# Patient Record
Sex: Female | Born: 2000 | Race: Black or African American | Hispanic: No | Marital: Single | Smoking: Never smoker
Health system: Southern US, Community
[De-identification: ages and names within clinical notes are randomized; demographics above are authoritative.]

## PROBLEM LIST (undated history)

## (undated) DIAGNOSIS — F909 Attention-deficit hyperactivity disorder, unspecified type: Secondary | ICD-10-CM

## (undated) DIAGNOSIS — F419 Anxiety disorder, unspecified: Secondary | ICD-10-CM

## (undated) HISTORY — DX: Attention-deficit hyperactivity disorder, unspecified type: F90.9

---
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
## 2019-06-25 DIAGNOSIS — Z8616 Personal history of COVID-19: Secondary | ICD-10-CM | POA: Diagnosis not present

## 2020-03-03 DIAGNOSIS — M25512 Pain in left shoulder: Secondary | ICD-10-CM | POA: Diagnosis not present

## 2020-03-03 DIAGNOSIS — G8911 Acute pain due to trauma: Secondary | ICD-10-CM | POA: Diagnosis not present

## 2021-01-20 DIAGNOSIS — Z23 Encounter for immunization: Secondary | ICD-10-CM | POA: Diagnosis not present

## 2021-03-19 ENCOUNTER — Emergency Department (HOSPITAL_COMMUNITY): Payer: Medicaid Other

## 2021-03-19 ENCOUNTER — Emergency Department (HOSPITAL_COMMUNITY)
Admission: EM | Admit: 2021-03-19 | Discharge: 2021-03-19 | Disposition: A | Discharge status: Home / Self Care | Payer: Medicaid Other | Attending: Emergency Medicine | Admitting: Emergency Medicine

## 2021-03-19 ENCOUNTER — Encounter (HOSPITAL_COMMUNITY): Payer: Self-pay | Admitting: Emergency Medicine

## 2021-03-19 ENCOUNTER — Other Ambulatory Visit: Payer: Self-pay

## 2021-03-19 DIAGNOSIS — R079 Chest pain, unspecified: Secondary | ICD-10-CM | POA: Diagnosis not present

## 2021-03-19 DIAGNOSIS — R0789 Other chest pain: Secondary | ICD-10-CM | POA: Diagnosis not present

## 2021-03-19 DIAGNOSIS — R072 Precordial pain: Secondary | ICD-10-CM | POA: Diagnosis not present

## 2021-03-19 DIAGNOSIS — Z20822 Contact with and (suspected) exposure to covid-19: Secondary | ICD-10-CM | POA: Insufficient documentation

## 2021-03-19 DIAGNOSIS — R0602 Shortness of breath: Secondary | ICD-10-CM | POA: Insufficient documentation

## 2021-03-19 DIAGNOSIS — Z7722 Contact with and (suspected) exposure to environmental tobacco smoke (acute) (chronic): Secondary | ICD-10-CM | POA: Insufficient documentation

## 2021-03-19 DIAGNOSIS — R000 Tachycardia, unspecified: Secondary | ICD-10-CM | POA: Insufficient documentation

## 2021-03-19 LAB — BASIC METABOLIC PANEL
Anion gap: 12 (ref 5–15)
BUN: 13 mg/dL (ref 6–20)
CO2: 20 mmol/L — ABNORMAL LOW (ref 22–32)
Calcium: 9.3 mg/dL (ref 8.9–10.3)
Chloride: 103 mmol/L (ref 98–111)
Creatinine, Ser: 1.04 mg/dL — ABNORMAL HIGH (ref 0.44–1.00)
GFR, Estimated: >60 mL/min (ref >60)
Glucose, Bld: 114 mg/dL — ABNORMAL HIGH (ref 70–99)
Potassium: 3 mmol/L — ABNORMAL LOW (ref 3.5–5.1)
Sodium: 135 mmol/L (ref 135–145)

## 2021-03-19 LAB — CBC
HCT: 38.7 % (ref 36.0–46.0)
Hemoglobin: 12.7 g/dL (ref 12.0–15.0)
MCH: 30.2 pg (ref 26.0–34.0)
MCHC: 32.8 g/dL (ref 30.0–36.0)
MCV: 92.1 fL (ref 80.0–100.0)
Platelets: 232 10*3/uL (ref 150–400)
RBC: 4.2 MIL/uL (ref 3.87–5.11)
RDW: 13.2 % (ref 11.5–15.5)
WBC: 7 10*3/uL (ref 4.0–10.5)
nRBC: 0 % (ref 0.0–0.2)

## 2021-03-19 LAB — TROPONIN I (HIGH SENSITIVITY): Troponin I (High Sensitivity): 13 ng/L (ref <18)

## 2021-03-19 LAB — I-STAT BETA HCG BLOOD, ED (MC, WL, AP ONLY): I-stat hCG, quantitative: <5 m[IU]/mL (ref <5)

## 2021-03-19 LAB — RESP PANEL BY RT-PCR (FLU A&B, COVID) ARPGX2
Influenza A by PCR: NEGATIVE
Influenza B by PCR: NEGATIVE
SARS Coronavirus 2 by RT PCR: NEGATIVE

## 2021-03-19 NOTE — ED Provider Notes (Signed)
[facility]-EMERGENCY DEPT Provider Note   CSN: [id number] Arrival date & time: 03/19/21  0347     History Chief Complaint  Patient presents with   Chest Pain   Shortness of Breath    Emily Maynard is a 20 y.o. female.  Patient is a 20 year old female with history of ADHD presenting with complaints of chest pain.  This evening she was on the back patio with her sister, then returned inside and laid down on the floor.  While she was laying there, she began to experience sharp pain to the center of her chest.  She had a strange feeling extending into her neck and felt short of breath.  The symptoms lasted approximately 1 hour, then presents for evaluation of them.  She denies any fevers, chills, or cough.  She denies any leg pain or swelling.  Patient has no prior cardiac history and no cardiac risk factors.  She reports being at the gym earlier today performing exercises with no chest pain or shortness of breath.  She does not recall injuring herself.  The history is provided by the patient.  Chest Pain Pain location:  Substernal area Pain quality: sharp   Pain radiates to:  Does not radiate Pain severity:  Moderate Onset quality:  Sudden Duration:  2 hours Timing:  Constant Progression:  Partially resolved Chronicity:  New Relieved by:  Nothing Worsened by:  Nothing Ineffective treatments:  None tried Associated symptoms: shortness of breath   Shortness of Breath Associated symptoms: chest pain       Past Medical History:  Diagnosis Date   ADHD (attention deficit hyperactivity disorder) 12/13/2012   Followed by Dr. Inda Coke: 03/29/2013 Rating scales do not show need for stimulants, no rx given.     Patient Active Problem List   Diagnosis Date Noted   Acne 06/10/2015   ADHD (attention deficit hyperactivity disorder), combined type 05/01/2014   Learning problem 12/13/2012   Adjustment reaction 08/14/2011    History reviewed. No pertinent surgical  history.   OB History   No obstetric history on file.     History reviewed. No pertinent family history.  Social History   Tobacco Use   Smoking status: Passive Smoke Exposure - Never Smoker   Smokeless tobacco: Never   Tobacco comments:    mom smokes   Substance Use Topics   Alcohol use: No    Alcohol/week: 0.0 standard drinks   Drug use: No    Home Medications Prior to Admission medications   Medication Sig Start Date End Date Taking? Authorizing Provider  clindamycin-benzoyl peroxide (BENZACLIN) gel Apply topically once in the morning Patient not taking: Reported on 11/28/2017 11/24/15   Verneda Skill, FNP  DIFFERIN 0.1 % cream Apply topically at bedtime. Patient not taking: Reported on 11/28/2017 09/05/15   Marijo File, MD  methylphenidate (CONCERTA) 18 MG PO CR tablet Take 1 tablet (18 mg total) by mouth daily. 08/04/16   Verneda Skill, FNP  methylphenidate (CONCERTA) 18 MG PO CR tablet Take 1 tablet (18 mg total) by mouth daily. 08/04/16   Verneda Skill, FNP    Allergies    Shellfish allergy  Review of Systems   Review of Systems  Respiratory:  Positive for shortness of breath.   Cardiovascular:  Positive for chest pain.  All other systems reviewed and are negative.  Physical Exam Updated Vital Signs There were no vitals taken for this visit.  Physical Exam Vitals and nursing note reviewed.  Constitutional:      General: She is not in acute distress.    Appearance: She is well-developed. She is not diaphoretic.  HENT:     Head: Normocephalic and atraumatic.  Cardiovascular:     Rate and Rhythm: Normal rate and regular rhythm.     Heart sounds: No murmur heard.   No friction rub. No gallop.  Pulmonary:     Effort: Pulmonary effort is normal. No respiratory distress.     Breath sounds: Normal breath sounds. No wheezing.     Comments: There is tenderness to the anterior chest wall just left of the sternum.  This reproduces her  symptoms. Abdominal:     General: Bowel sounds are normal. There is no distension.     Palpations: Abdomen is soft.     Tenderness: There is no abdominal tenderness.  Musculoskeletal:        General: Normal range of motion.     Cervical back: Normal range of motion and neck supple.     Right lower leg: No tenderness. No edema.     Left lower leg: No tenderness. No edema.  Skin:    General: Skin is warm and dry.  Neurological:     General: No focal deficit present.     Mental Status: She is alert and oriented to person, place, and time.    ED Results / Procedures / Treatments   Labs (all labs ordered are listed, but only abnormal results are displayed) Labs Reviewed  BASIC METABOLIC PANEL - Abnormal; Notable for the following components:      Result Value   Potassium 3.0 (*)    CO2 20 (*)    Glucose, Bld 114 (*)    Creatinine, Ser 1.04 (*)    All other components within normal limits  RESP PANEL BY RT-PCR (FLU A&B, COVID) ARPGX2  CBC  I-STAT BETA HCG BLOOD, ED ([facility], [facility], [facility] ONLY)  TROPONIN I (HIGH SENSITIVITY)    EKG EKG Interpretation  Date/Time:  Friday March 19 2021 03:54:24 EDT Ventricular Rate:  109 PR Interval:  158 QRS Duration: 72 QT Interval:  311 QTC Calculation: 419 R Axis:   68 Text Interpretation: Sinus tachycardia Right atrial enlargement Artifact in lead(s) I II III aVR aVL aVF V1 V3 Confirmed by Geoffery Lyons ([id number]) on 03/19/2021 4:30:50 AM  Radiology No results found.  Procedures Procedures   Medications Ordered in ED Medications - No data to display  ED Course  I have reviewed the triage vital signs and the nursing notes.  Pertinent labs & imaging results that were available during my care of the patient were reviewed by me and considered in my medical decision making (see chart for details).    MDM Rules/Calculators/A&P  Patient presenting here with complaints of chest pain that seems very atypical for cardiac pain.  She describes a  sharp pain that started in the left center of her chest while she was lying down and following a stressful conversation with her sister.  Patient's work-up is unremarkable including troponin, EKG, chest x-ray, and remainder of laboratory studies.  The discomfort she is experiencing is reproducible with palpation of the anterior chest wall and I strongly suspect a musculoskeletal etiology or possibly anxiety related.  I highly doubt pulmonary embolism as there is no tachycardia, no hypoxia, and no tachypnea.  She has no risk factors for this.  Patient to be discharged with NSAIDs and return as needed.  Final Clinical Impression(s) / ED Diagnoses Final diagnoses:  None    Rx / DC Orders ED Discharge Orders     None        Geoffery Lyons, MD 03/19/21 [phone]

## 2021-03-19 NOTE — Discharge Instructions (Addendum)
Begin taking ibuprofen 400 mg every 6 hours as needed for pain.  Follow-up with primary doctor if not improving in the next week, and return to the ER if you develop worsening pain, difficulty breathing, high fevers, or other issues.

## 2021-04-14 DIAGNOSIS — S80912A Unspecified superficial injury of left knee, initial encounter: Secondary | ICD-10-CM | POA: Diagnosis not present

## 2021-04-14 DIAGNOSIS — F411 Generalized anxiety disorder: Secondary | ICD-10-CM | POA: Diagnosis not present

## 2021-04-14 DIAGNOSIS — F321 Major depressive disorder, single episode, moderate: Secondary | ICD-10-CM | POA: Diagnosis not present

## 2021-04-14 DIAGNOSIS — F4011 Social phobia, generalized: Secondary | ICD-10-CM | POA: Diagnosis not present

## 2021-04-14 DIAGNOSIS — R051 Acute cough: Secondary | ICD-10-CM | POA: Diagnosis not present

## 2021-04-14 DIAGNOSIS — S80911A Unspecified superficial injury of right knee, initial encounter: Secondary | ICD-10-CM | POA: Diagnosis not present

## 2021-04-23 DIAGNOSIS — J00 Acute nasopharyngitis [common cold]: Secondary | ICD-10-CM | POA: Diagnosis not present

## 2021-04-23 DIAGNOSIS — Z3042 Encounter for surveillance of injectable contraceptive: Secondary | ICD-10-CM | POA: Diagnosis not present

## 2021-04-23 DIAGNOSIS — Z3202 Encounter for pregnancy test, result negative: Secondary | ICD-10-CM | POA: Diagnosis not present

## 2021-05-21 DIAGNOSIS — F411 Generalized anxiety disorder: Secondary | ICD-10-CM | POA: Diagnosis not present

## 2021-05-21 DIAGNOSIS — F331 Major depressive disorder, recurrent, moderate: Secondary | ICD-10-CM | POA: Diagnosis not present

## 2021-05-21 DIAGNOSIS — F4011 Social phobia, generalized: Secondary | ICD-10-CM | POA: Diagnosis not present

## 2021-07-02 DIAGNOSIS — F411 Generalized anxiety disorder: Secondary | ICD-10-CM | POA: Diagnosis not present

## 2021-07-02 DIAGNOSIS — F331 Major depressive disorder, recurrent, moderate: Secondary | ICD-10-CM | POA: Diagnosis not present

## 2021-08-06 DIAGNOSIS — F411 Generalized anxiety disorder: Secondary | ICD-10-CM | POA: Diagnosis not present

## 2021-08-06 DIAGNOSIS — F3341 Major depressive disorder, recurrent, in partial remission: Secondary | ICD-10-CM | POA: Diagnosis not present

## 2021-09-10 DIAGNOSIS — F3341 Major depressive disorder, recurrent, in partial remission: Secondary | ICD-10-CM | POA: Diagnosis not present

## 2021-09-10 DIAGNOSIS — F902 Attention-deficit hyperactivity disorder, combined type: Secondary | ICD-10-CM | POA: Diagnosis not present

## 2021-09-10 DIAGNOSIS — F411 Generalized anxiety disorder: Secondary | ICD-10-CM | POA: Diagnosis not present

## 2021-09-27 ENCOUNTER — Other Ambulatory Visit (HOSPITAL_COMMUNITY)
Admission: RE | Admit: 2021-09-27 | Discharge: 2021-09-27 | Disposition: A | Discharge status: Home / Self Care | Payer: Medicaid Other | Source: Ambulatory Visit | Attending: Advanced Practice Midwife | Admitting: Advanced Practice Midwife

## 2021-09-27 ENCOUNTER — Ambulatory Visit (INDEPENDENT_AMBULATORY_CARE_PROVIDER_SITE_OTHER): Payer: Medicaid Other | Admitting: Advanced Practice Midwife

## 2021-09-27 ENCOUNTER — Encounter: Payer: Self-pay | Admitting: Advanced Practice Midwife

## 2021-09-27 VITALS — BP 113/73 | HR 62 | Ht 65.0 in | Wt 127.9 lb

## 2021-09-27 DIAGNOSIS — Z3009 Encounter for other general counseling and advice on contraception: Secondary | ICD-10-CM | POA: Diagnosis not present

## 2021-09-27 DIAGNOSIS — Z113 Encounter for screening for infections with a predominantly sexual mode of transmission: Secondary | ICD-10-CM | POA: Insufficient documentation

## 2021-09-27 DIAGNOSIS — Z01419 Encounter for gynecological examination (general) (routine) without abnormal findings: Secondary | ICD-10-CM | POA: Diagnosis not present

## 2021-09-27 DIAGNOSIS — Z124 Encounter for screening for malignant neoplasm of cervix: Secondary | ICD-10-CM | POA: Insufficient documentation

## 2021-09-27 DIAGNOSIS — R109 Unspecified abdominal pain: Secondary | ICD-10-CM | POA: Diagnosis not present

## 2021-09-27 DIAGNOSIS — A749 Chlamydial infection, unspecified: Secondary | ICD-10-CM

## 2021-09-27 DIAGNOSIS — Z3042 Encounter for surveillance of injectable contraceptive: Secondary | ICD-10-CM | POA: Diagnosis not present

## 2021-09-27 LAB — POCT URINE PREGNANCY: Preg Test, Ur: NEGATIVE

## 2021-09-27 MED ORDER — MEDROXYPROGESTERONE ACETATE 150 MG/ML IM SUSP: 150.0000 mg | INTRAMUSCULAR | 4 refills | Status: AC

## 2021-09-27 MED ORDER — MEDROXYPROGESTERONE ACETATE 150 MG/ML IM SUSP
150.0000 mg | Freq: Once | INTRAMUSCULAR | Status: AC
  Administered 2021-09-27: 150 mg via INTRAMUSCULAR

## 2021-09-27 NOTE — Progress Notes (Signed)
? ?  Subjective:  ?  ? Emily Maynard is a 21 y.o. female here at [facility] for a routine exam.  Current complaints: abdominal pain, upper/epigastric and lower cramping pain that is intermittent, mostly occurs at night when lying down.  Hx anxiety, has psychiatry and is taking medications. She reports her anxiety is improved and she feels like she is doing well.  Personal health questionnaire reviewed: yes. ? ?Do you have a primary care provider? yes ?Do you feel safe at home? yes ? ?[place] Office Visit from 09/27/2021 in [place]  ?PHQ-2 Total Score 2  ? ?  ? ? ?Health Maintenance Due  ?Topic Date Due  ? Hepatitis C Screening  Never done  ? PAP-Cervical Cytology Screening  Never done  ? PAP SMEAR-Modifier  Never done  ? TETANUS/TDAP  09/08/2020  ?  ? ?Risk factors for chronic health problems: ?Smoking: ?Alchohol/how much: ?Pt BMI: Body mass index is 21.28 kg/m?. ?  ?Gynecologic History ?Patient's last menstrual period was 09/17/2021. ?Contraception: condoms ?Last Pap: n/a.  ?Last mammogram: n/a.  ? ?Obstetric History ?OB History  ?No obstetric history on file.  ? ? ? ?The following portions of the patient's history were reviewed and updated as appropriate: allergies, current medications, past family history, past medical history, past social history, past surgical history, and problem list. ? ?Review of Systems ?Pertinent items noted in HPI and remainder of comprehensive ROS otherwise negative.  ?  ?Objective:  ? ?BP 113/73   Pulse 62   Ht 5\' 5"  (1.651 m)   Wt 127 lb 14.4 oz (58 kg)   LMP 09/17/2021   BMI 21.28 kg/m?  ?VS reviewed, nursing note reviewed,  ?Constitutional: well developed, well nourished, no distress ?HEENT: normocephalic ?CV: normal rate ?Pulm/chest wall: normal effort ?Breast Exam:  exam performed: right breast normal without mass, skin or nipple changes or axillary nodes, left breast normal without mass, skin or nipple changes or axillary nodes, bilateral  nipple piercing without complication ?Abdomen: soft ?Neuro: alert and oriented x 3 ?Skin: warm, dry ?Psych: affect normal ?Pelvic exam: Performed: Cervix pink, visually closed, without lesion, scant white creamy discharge, vaginal walls and external genitalia normal ?Bimanual exam: Cervix 0/long/high, firm, anterior, neg CMT, uterus nontender, nonenlarged, adnexa without tenderness, enlargement, or mass  ? ? ?   ?Assessment/Plan:  ? ?1. Routine screening for STI (sexually transmitted infection) ? ?- Cervicovaginal ancillary only( [place]) ?- HepB+HepC+HIV Panel ?- RPR ? ?2. Screening for cervical cancer ? ?- Cytology - PAP( [place]) ? ?3. Abdominal pain in female ?--Pain in upper abdomen c/w epigastric/digestive pain.   ? ?4. Well woman exam with routine gynecological exam ? ? ?5. General counseling and advice for contraceptive management ?--Discussed pt contraceptive plans and reviewed contraceptive methods based on pt preferences and effectiveness.  Pt on the phone with her mother, who also asked questions and was part of decision making at pt request.  Pt prefers Depo at this time, will reevaluate at a later time.  She had weight gain with Depo previously but is willing to try it again. ? ?- medroxyPROGESTERone (DEPO-PROVERA) injection 150 mg ?- medroxyPROGESTERone (DEPO-PROVERA) 150 MG/ML injection; Inject 1 mL (150 mg total) into the muscle every 3 (three) months.  Dispense: 1 mL; Refill: 4  ? ? ? ?Return in about 1 year (around 09/28/2022) for annual exam.  ? ?Fatima Blank, CNM ?4:13 PM   ?

## 2021-09-27 NOTE — Patient Instructions (Signed)
For upper abdominal pain, try Pepcid (famotidine) 20 mg twice per day as needed.  If symptoms do not improve, follow up with primary care.   ?

## 2021-09-27 NOTE — Addendum Note (Signed)
Addended by: Jearld Adjutant on: 09/27/2021 04:36 PM ? ? Modules accepted: Orders ? ?

## 2021-09-27 NOTE — Progress Notes (Signed)
New GYN pt in office to establish care. Pt c/o abdominal pain in the upper regions. Pt requesting STD testing.  ? ?Pt scored high on PHQ9 and GAD7 but is currently on medications and seeing a counselor.  ?

## 2021-09-27 NOTE — Addendum Note (Signed)
Addended by: Jearld Adjutant on: 09/27/2021 04:31 PM ? ? Modules accepted: Orders ? ?

## 2021-09-28 ENCOUNTER — Other Ambulatory Visit: Payer: Medicaid Other

## 2021-09-28 DIAGNOSIS — Z01419 Encounter for gynecological examination (general) (routine) without abnormal findings: Secondary | ICD-10-CM | POA: Diagnosis not present

## 2021-09-28 DIAGNOSIS — Z113 Encounter for screening for infections with a predominantly sexual mode of transmission: Secondary | ICD-10-CM | POA: Diagnosis not present

## 2021-09-28 LAB — RPR: RPR Ser Ql: NONREACTIVE

## 2021-09-29 LAB — CERVICOVAGINAL ANCILLARY ONLY
Chlamydia: POSITIVE — AB
Comment: NEGATIVE
Comment: NEGATIVE
Comment: NORMAL
Neisseria Gonorrhea: NEGATIVE
Trichomonas: NEGATIVE

## 2021-09-29 LAB — HEPATITIS C ANTIBODY: Hep C Virus Ab: NONREACTIVE

## 2021-09-29 LAB — HEPATITIS B SURFACE ANTIGEN: Hepatitis B Surface Ag: NEGATIVE

## 2021-09-29 LAB — CYTOLOGY - PAP: Diagnosis: NEGATIVE

## 2021-09-29 LAB — HIV ANTIBODY (ROUTINE TESTING W REFLEX): HIV Screen 4th Generation wRfx: NONREACTIVE

## 2021-09-30 ENCOUNTER — Other Ambulatory Visit: Payer: Self-pay | Admitting: Emergency Medicine

## 2021-09-30 MED ORDER — AZITHROMYCIN 500 MG PO TABS: 500.0000 mg | ORAL_TABLET | Freq: Once | ORAL | 0 refills | Status: AC

## 2021-09-30 NOTE — Progress Notes (Signed)
TC to patient to discuss results and Rx. Rx sent to pharmacy.  ?

## 2021-10-02 ENCOUNTER — Telehealth: Payer: Self-pay | Admitting: Advanced Practice Midwife

## 2021-10-02 MED ORDER — DOXYCYCLINE HYCLATE 100 MG PO CAPS: 100.0000 mg | ORAL_CAPSULE | Freq: Two times a day (BID) | ORAL | 1 refills | Status: DC

## 2021-10-02 NOTE — Addendum Note (Signed)
Addended by: Sharen Counter A on: 10/02/2021 02:30 PM ? ? Modules accepted: Orders ? ?

## 2021-10-02 NOTE — Telephone Encounter (Signed)
Called pt to discuss lab results with positive chlamydia test on 09/27/21.  Message left for pt to return call.  I see that RN spoke with pt on 4/20 about results and Rx was sent by Dr Alysia Penna. Pt Pap, and other STI labs were all normal.  No other abnormal labs to review with pt if she returns call. Her partner needs to be treated as well.   ?

## 2021-10-07 DIAGNOSIS — F411 Generalized anxiety disorder: Secondary | ICD-10-CM | POA: Diagnosis not present

## 2021-10-07 DIAGNOSIS — R519 Headache, unspecified: Secondary | ICD-10-CM | POA: Diagnosis not present

## 2021-10-07 DIAGNOSIS — R0782 Intercostal pain: Secondary | ICD-10-CM | POA: Diagnosis not present

## 2021-10-15 ENCOUNTER — Encounter (HOSPITAL_COMMUNITY): Payer: Self-pay

## 2021-10-15 ENCOUNTER — Other Ambulatory Visit: Payer: Self-pay

## 2021-10-15 ENCOUNTER — Emergency Department (HOSPITAL_COMMUNITY): Payer: Medicaid Other

## 2021-10-15 ENCOUNTER — Emergency Department (HOSPITAL_COMMUNITY)
Admission: EM | Admit: 2021-10-15 | Discharge: 2021-10-15 | Disposition: A | Discharge status: Home / Self Care | Payer: Medicaid Other | Attending: Emergency Medicine | Admitting: Emergency Medicine

## 2021-10-15 DIAGNOSIS — F410 Panic disorder [episodic paroxysmal anxiety] without agoraphobia: Secondary | ICD-10-CM | POA: Diagnosis not present

## 2021-10-15 DIAGNOSIS — F3341 Major depressive disorder, recurrent, in partial remission: Secondary | ICD-10-CM | POA: Diagnosis not present

## 2021-10-15 DIAGNOSIS — R7989 Other specified abnormal findings of blood chemistry: Secondary | ICD-10-CM

## 2021-10-15 DIAGNOSIS — R200 Anesthesia of skin: Secondary | ICD-10-CM | POA: Diagnosis not present

## 2021-10-15 DIAGNOSIS — E039 Hypothyroidism, unspecified: Secondary | ICD-10-CM | POA: Diagnosis not present

## 2021-10-15 DIAGNOSIS — R946 Abnormal results of thyroid function studies: Secondary | ICD-10-CM | POA: Diagnosis not present

## 2021-10-15 DIAGNOSIS — R202 Paresthesia of skin: Secondary | ICD-10-CM | POA: Diagnosis not present

## 2021-10-15 DIAGNOSIS — R9431 Abnormal electrocardiogram [ECG] [EKG]: Secondary | ICD-10-CM | POA: Diagnosis not present

## 2021-10-15 DIAGNOSIS — F411 Generalized anxiety disorder: Secondary | ICD-10-CM | POA: Diagnosis not present

## 2021-10-15 DIAGNOSIS — R479 Unspecified speech disturbances: Secondary | ICD-10-CM | POA: Diagnosis not present

## 2021-10-15 LAB — CBC WITH DIFFERENTIAL/PLATELET
Abs Immature Granulocytes: 0.01 10*3/uL (ref 0.00–0.07)
Basophils Absolute: 0 10*3/uL (ref 0.0–0.1)
Basophils Relative: 1 %
Eosinophils Absolute: 0.1 10*3/uL (ref 0.0–0.5)
Eosinophils Relative: 1 %
HCT: 40.8 % (ref 36.0–46.0)
Hemoglobin: 13.5 g/dL (ref 12.0–15.0)
Immature Granulocytes: 0 %
Lymphocytes Relative: 40 %
Lymphs Abs: 1.9 10*3/uL (ref 0.7–4.0)
MCH: 30.1 pg (ref 26.0–34.0)
MCHC: 33.1 g/dL (ref 30.0–36.0)
MCV: 91.1 fL (ref 80.0–100.0)
Monocytes Absolute: 0.3 10*3/uL (ref 0.1–1.0)
Monocytes Relative: 6 %
Neutro Abs: 2.4 10*3/uL (ref 1.7–7.7)
Neutrophils Relative %: 52 %
Platelets: 238 10*3/uL (ref 150–400)
RBC: 4.48 MIL/uL (ref 3.87–5.11)
RDW: 12.6 % (ref 11.5–15.5)
WBC: 4.6 10*3/uL (ref 4.0–10.5)
nRBC: 0 % (ref 0.0–0.2)

## 2021-10-15 LAB — URINALYSIS, ROUTINE W REFLEX MICROSCOPIC
Bilirubin Urine: NEGATIVE
Glucose, UA: NEGATIVE mg/dL
Ketones, ur: NEGATIVE mg/dL
Nitrite: NEGATIVE
Protein, ur: NEGATIVE mg/dL
Specific Gravity, Urine: 1.014 (ref 1.005–1.030)
pH: 6 (ref 5.0–8.0)

## 2021-10-15 LAB — I-STAT BETA HCG BLOOD, ED (MC, WL, AP ONLY): I-stat hCG, quantitative: <5 m[IU]/mL (ref <5)

## 2021-10-15 LAB — COMPREHENSIVE METABOLIC PANEL
ALT: 11 U/L (ref 0–44)
AST: 17 U/L (ref 15–41)
Albumin: 4.2 g/dL (ref 3.5–5.0)
Alkaline Phosphatase: 42 U/L (ref 38–126)
Anion gap: 6 (ref 5–15)
BUN: 16 mg/dL (ref 6–20)
CO2: 23 mmol/L (ref 22–32)
Calcium: 9.1 mg/dL (ref 8.9–10.3)
Chloride: 107 mmol/L (ref 98–111)
Creatinine, Ser: 0.99 mg/dL (ref 0.44–1.00)
GFR, Estimated: >60 mL/min (ref >60)
Glucose, Bld: 112 mg/dL — ABNORMAL HIGH (ref 70–99)
Potassium: 3.8 mmol/L (ref 3.5–5.1)
Sodium: 136 mmol/L (ref 135–145)
Total Bilirubin: 0.8 mg/dL (ref 0.3–1.2)
Total Protein: 7.7 g/dL (ref 6.5–8.1)

## 2021-10-15 LAB — TSH: TSH: 0.347 u[IU]/mL — ABNORMAL LOW (ref 0.350–4.500)

## 2021-10-15 MED ORDER — LORAZEPAM 1 MG PO TABS: ORAL_TABLET | ORAL | 0 refills | Status: DC

## 2021-10-15 NOTE — Discharge Instructions (Signed)
Your workup today was overall very reassuring, however, one of your thyroid function tests indicates that you may have too much thyroid hormone circulating in your body which can cause symptoms of anxiety including palpitations, shortness of breath and tremors.  I have sent you in a referral for the [facility] to establish care to have this better evaluated.  You can also see if the student health center is equipped to handle this as well.  Until then continue managing your anxiety as described.  I have sent you in a few tablets of Ativan that you will take as needed-meaning when you are actually experiencing a panic attack-which can help improve your symptoms at that time.  If you need more of this medication, you can follow-up with campus health. ?

## 2021-10-15 NOTE — ED Triage Notes (Signed)
Patient's stepmother reports that the patient has been having more frequent panic attacks and the last one at 1200 today. Patient states when these panic attacks occur she gets numbness of the left arm and has difficulty with speech. ?Patient states she is not currently having a panic attack and does not have numbness of the left arm. No slurred speech noted. ?Patient saw the campus NP today and referred the patient to the ED. ?

## 2021-10-15 NOTE — ED Provider Triage Note (Signed)
Emergency Medicine Provider Triage Evaluation Note ? ?Emily Maynard , a 21 y.o. female  was evaluated in triage.  Pt complains of panic attack. SHe has a hx of panic attacks. She gets L arm paresthisa, hand cramping. Facial numbness and inablity to speak. Sent in by [facility] for further eval. Will see neuro this coming Tuesday. Sxs have resolved. Hx of abnormal thyroid studies. ? ?Review of Systems  ?Positive: panic ?Negative: sob ? ?Physical Exam  ?BP 116/74 (BP Location: Left Arm)   Pulse 69   Temp 98.2 ?F (36.8 ?C) (Oral)   Resp 16   Ht 5\' 5"  (1.651 m)   Wt 59 kg   LMP 09/17/2021   SpO2 97%   BMI 21.63 kg/m?  ?Gen:   Awake, no distress   ?Resp:  Normal effort  ?MSK:   Moves extremities without difficulty  ?Other:  No focal deficits ? ?Medical Decision Making  ?Medically screening exam initiated at 3:55 PM.  Appropriate orders placed.  Cyril Railey was informed that the remainder of the evaluation will be completed by another provider, this initial triage assessment does not replace that evaluation, and the importance of remaining in the ED until their evaluation is complete. ? ?Work up inititated ?  ?Emily Ports, PA-C ?10/15/21 1559 ? ?

## 2021-10-17 NOTE — ED Provider Notes (Signed)
?[facility]-EMERGENCY DEPT ?Provider Note ? ? ?CSN: [id number] ?Arrival date & time: 10/15/21  1544 ? ?  ? ?History ? ?Chief Complaint  ?Patient presents with  ? Panic Attack  ? ? ?Emily Maynard is a 21 y.o. female with history of ADHD who comes in with complaints of more frequent panic attacks with most recent one earlier today.  Patient states that when her panic acts occur, she experiences numbness of the left arm and feels that she has difficulty with speech.  She also feels chest pain described as pressure and feelings of doom.  Patient saw NP at student health today who referred her to the emergency department for additional evaluation.  Currently, patient denies all symptoms and feels fine.  She takes hydroxyzine and methylphenidate for her medications. ? ?HPI ? ?  ? ?Home Medications ?Prior to Admission medications   ?Medication Sig Start Date End Date Taking? Authorizing Provider  ?LORazepam (ATIVAN) 1 MG tablet Take 1 tablet when experiencing panic attack symptoms. Do not drive afterwards. 10/15/21  Yes Raynald Blend R, PA-C  ?clindamycin-benzoyl peroxide (BENZACLIN) gel Apply topically once in the morning ?Patient not taking: Reported on 11/28/2017 11/24/15   Verneda Skill, FNP  ?DIFFERIN 0.1 % cream Apply topically at bedtime. ?Patient not taking: Reported on 11/28/2017 09/05/15   Marijo File, MD  ?medroxyPROGESTERone (DEPO-PROVERA) 150 MG/ML injection Inject 1 mL (150 mg total) into the muscle every 3 (three) months. 09/27/21   Leftwich-Kirby, Wilmer Floor, CNM  ?methylphenidate (CONCERTA) 18 MG PO CR tablet Take 1 tablet (18 mg total) by mouth daily. ?Patient not taking: Reported on 09/27/2021 08/04/16   Verneda Skill, FNP  ?methylphenidate (CONCERTA) 18 MG PO CR tablet Take 1 tablet (18 mg total) by mouth daily. ?Patient not taking: Reported on 09/27/2021 08/04/16   Verneda Skill, FNP  ?   ? ?Allergies    ?Shellfish allergy   ? ?Review of Systems   ?Review of Systems ? ?Physical  Exam ?Updated Vital Signs ?BP (!) 108/51 (BP Location: Left Arm)   Pulse 65   Temp 98 ?F (36.7 ?C) (Oral)   Resp 20   Ht 5\' 5"  (1.651 m)   Wt 59 kg   LMP 09/17/2021   SpO2 99%   BMI 21.63 kg/m?  ?Physical Exam ?Vitals and nursing note reviewed.  ?Constitutional:   ?   General: She is not in acute distress. ?   Appearance: She is not ill-appearing.  ?HENT:  ?   Head: Atraumatic.  ?Eyes:  ?   Extraocular Movements: Extraocular movements intact.  ?   Conjunctiva/sclera: Conjunctivae normal.  ?   Pupils: Pupils are equal, round, and reactive to light.  ?   Comments: No obvious proptosis of the eyes  ?Cardiovascular:  ?   Rate and Rhythm: Normal rate and regular rhythm.  ?   Pulses: Normal pulses.  ?   Heart sounds: No murmur heard. ?Pulmonary:  ?   Effort: Pulmonary effort is normal. No respiratory distress.  ?   Breath sounds: Normal breath sounds.  ?Abdominal:  ?   General: Abdomen is flat. There is no distension.  ?   Palpations: Abdomen is soft.  ?   Tenderness: There is no abdominal tenderness.  ?Musculoskeletal:     ?   General: Normal range of motion.  ?   Cervical back: Normal range of motion.  ?Skin: ?   General: Skin is warm and dry.  ?   Capillary Refill: Capillary  refill takes less than 2 seconds.  ?Neurological:  ?   General: No focal deficit present.  ?   Mental Status: She is alert.  ?   Comments: Speech is clear, able to follow commands ?CN III-XII intact ?Normal strength in upper and lower extremities bilaterally including dorsiflexion and plantar flexion, strong and equal grip strength ?Sensation normal to light and sharp touch ?Moves extremities without ataxia, coordination intact ?Normal finger to nose and rapid alternating movements ?No pronator drift ? ?  ?Psychiatric:     ?   Mood and Affect: Mood normal.  ? ? ?ED Results / Procedures / Treatments   ?Labs ?(all labs ordered are listed, but only abnormal results are displayed) ?Labs Reviewed  ?URINALYSIS, ROUTINE W REFLEX MICROSCOPIC -  Abnormal; Notable for the following components:  ?    Result Value  ? APPearance HAZY (*)   ? Hgb urine dipstick MODERATE (*)   ? Leukocytes,Ua SMALL (*)   ? Bacteria, UA MANY (*)   ? All other components within normal limits  ?COMPREHENSIVE METABOLIC PANEL - Abnormal; Notable for the following components:  ? Glucose, Bld 112 (*)   ? All other components within normal limits  ?TSH - Abnormal; Notable for the following components:  ? TSH 0.347 (*)   ? All other components within normal limits  ?CBC WITH DIFFERENTIAL/PLATELET  ?I-STAT BETA HCG BLOOD, ED ([facility], [facility], [facility] ONLY)  ? ? ?EKG ?EKG Interpretation ? ?Date/Time:  Friday Oct 15 2021 16:22:15 EDT ?Ventricular Rate:  65 ?PR Interval:  161 ?QRS Duration: 73 ?QT Interval:  367 ?QTC Calculation: 382 ?R Axis:   75 ?Text Interpretation: Sinus rhythm No significant change since last tracing Confirmed by Gwyneth SproutPlunkett, Whitney ([id number]) on 10/16/2021 3:16:56 PM ? ?Radiology ?No results found. ? ?Procedures ?Procedures  ? ? ?Medications Ordered in ED ?Medications - No data to display ? ?ED Course/ Medical Decision Making/ A&P ?  ?                        ?Medical Decision Making ?Risk ?Prescription drug management. ? ? ?Social determinants of health:  ?Social History  ? ?Socioeconomic History  ? Marital status: Single  ?  Spouse name: Not on file  ? Number of children: Not on file  ? Years of education: Not on file  ? Highest education level: Not on file  ?Occupational History  ? Not on file  ?Tobacco Use  ? Smoking status: Never  ?  Passive exposure: Yes  ? Smokeless tobacco: Never  ? Tobacco comments:  ?  mom smokes   ?Vaping Use  ? Vaping Use: Never used  ?Substance and Sexual Activity  ? Alcohol use: Yes  ?  Comment: socially  ? Drug use: Yes  ?  Types: Marijuana  ? Sexual activity: Yes  ?  Partners: Male  ?Other Topics Concern  ? Not on file  ?Social History Narrative  ? Lives with:  patient, mother and sister  ? Family relations:  good  ? Friends/Peers:  has few friends  ? School:   is in 10th grade and is doing fairly well  ? Future Plans:  college  ? Nutrition/Eating Behaviors: Eats well   ? Exercise:  none  ? Sports:  basketball and track  ? Sleep:  no sleep issues  ?   ? Confidentiality was discussed with the patient and if applicable, with caregiver as well.  ?   ? Patient's personal or confidential  phone number:   ? Tobacco?  no  ? Drugs/ETOH?  no  ? Partner preference?  female Sexually Active?  no    ? Pregnancy Prevention:  condoms, reviewed condoms & plan B  ? Safe at home, in school & in relationships?  Yes  ? Safe to self?  Yes   ?   ? ?Social Determinants of Health  ? ?Financial Resource Strain: Not on file  ?Food Insecurity: Not on file  ?Transportation Needs: Not on file  ?Physical Activity: Not on file  ?Stress: Not on file  ?Social Connections: Not on file  ?Intimate Partner Violence: Not on file  ? ? ? ?Initial impression: ? ?This patient presents to the ED for concern of panic attacks, this involves an extensive number of treatment options, and is a complaint that carries with it a high risk of complications and morbidity.   Differentials include anxiety, medication side effect, thyroid disorder, strokes.  ? ?Comorbidities affecting care:  ?Anxiety ? ?Additional history obtained: ?Stepmother ? ?Lab Tests ? ?I Ordered, reviewed, and interpreted labs. The pertinent results include:  ?CMP normal, negative pregnancy test, CBC normal ?UA with possible UTI, patient denies UTI symptoms ?TSH low ? ?Imaging Studies ordered: ? ?I ordered imaging studies including  ?CT head without acute findings ?I independently visualized and interpreted imaging and I agree with the radiologist interpretation.  ? ?EKG: ?Normal sinus rhythm ? ? ?ED Course/Re-evaluation: ?21 year old female in no acute distress, nontoxic-appearing presents to the ED for evaluation of the frequent panic attacks with associated numbness and difficulty speaking during these episodes.  Patient's vitals are without significant  abnormalities.  Physical exam shows thin 21 year old female without significant findings.  CT head was normal.  Labs were overall normal, her though her TSH was slightly low.  It is possible that patient has un

## 2021-10-27 ENCOUNTER — Encounter: Payer: Self-pay | Admitting: Nurse Practitioner

## 2021-10-27 ENCOUNTER — Ambulatory Visit: Payer: Medicaid Other | Attending: Nurse Practitioner | Admitting: Nurse Practitioner

## 2021-10-27 ENCOUNTER — Other Ambulatory Visit (HOSPITAL_COMMUNITY)
Admission: RE | Admit: 2021-10-27 | Discharge: 2021-10-27 | Disposition: A | Discharge status: Home / Self Care | Payer: Medicaid Other | Source: Ambulatory Visit | Attending: Nurse Practitioner | Admitting: Nurse Practitioner

## 2021-10-27 VITALS — BP 105/72 | HR 65 | Temp 98.7°F | Resp 16 | Ht 65.0 in | Wt 132.0 lb

## 2021-10-27 DIAGNOSIS — F902 Attention-deficit hyperactivity disorder, combined type: Secondary | ICD-10-CM | POA: Diagnosis not present

## 2021-10-27 DIAGNOSIS — Z7251 High risk heterosexual behavior: Secondary | ICD-10-CM | POA: Diagnosis not present

## 2021-10-27 DIAGNOSIS — F32A Depression, unspecified: Secondary | ICD-10-CM | POA: Diagnosis not present

## 2021-10-27 DIAGNOSIS — Z7689 Persons encountering health services in other specified circumstances: Secondary | ICD-10-CM

## 2021-10-27 DIAGNOSIS — Z114 Encounter for screening for human immunodeficiency virus [HIV]: Secondary | ICD-10-CM

## 2021-10-27 DIAGNOSIS — Z23 Encounter for immunization: Secondary | ICD-10-CM

## 2021-10-27 DIAGNOSIS — F419 Anxiety disorder, unspecified: Secondary | ICD-10-CM | POA: Diagnosis not present

## 2021-10-27 MED ORDER — HYDROXYZINE HCL 25 MG PO TABS: 25.0000 mg | ORAL_TABLET | Freq: Three times a day (TID) | ORAL | 3 refills | Status: DC | PRN

## 2021-10-27 MED ORDER — METHYLPHENIDATE HCL ER (OSM) 18 MG PO TBCR: 18.0000 mg | EXTENDED_RELEASE_TABLET | Freq: Every day | ORAL | 0 refills | Status: DC

## 2021-10-27 MED ORDER — ESCITALOPRAM OXALATE 20 MG PO TABS: 20.0000 mg | ORAL_TABLET | Freq: Every day | ORAL | 1 refills | Status: DC

## 2021-10-27 NOTE — Progress Notes (Signed)
? ?Assessment & Plan:  ?Emily Maynard was seen today for establish care and hospitalization follow-up. ? ?Diagnoses and all orders for this visit: ? ?Encounter to establish care ? ?ADHD (attention deficit hyperactivity disorder), combined type ?-     methylphenidate (CONCERTA) 18 MG PO CR tablet; Take 1 tablet (18 mg total) by mouth daily. ? ?Anxiety and depression ?-     Ambulatory referral to Integrated Behavioral Health ?-     escitalopram (LEXAPRO) 20 MG tablet; Take 1 tablet (20 mg total) by mouth daily. ?-     hydrOXYzine (ATARAX) 25 MG tablet; Take 1 tablet (25 mg total) by mouth 3 (three) times daily as needed. ? ?High risk heterosexual behavior ?-     Cervicovaginal ancillary only ? ?Encounter for screening for HIV ?-     HIV antibody (with reflex) ? ?Need for Tdap vaccination ?-     Tdap vaccine greater than or equal to 7yo IM ? ? ? ?Patient has been counseled on age-appropriate routine health concerns for screening and prevention. These are reviewed and up-to-date. Referrals have been placed accordingly. Immunizations are up-to-date or declined.    ?Subjective:  ? ?Chief Complaint  ?Patient presents with  ? Establish Care  ? Hospitalization Follow-up  ? ?HPI ?Emily Maynard 21 y.o. female presents to office today for hospital follow up to anxiety and to establish care. ? ?She has a past medical history of ADHD(12/13/2012) and adjustment disorder ? ?ADHD ?She was diagnosed with ADHD in grade school. Most recently treated with concerta 18mg  daily.   ? ?She transferred to [employer] from [employer] last year. States one day at [facility] she just woke up and did not leave her room for about a week. She also started missing classes and stopped running track. Her Grandmother passed about 1 year ago and she recalls having a panic attack shortly thereafter. Over the past year her panic attacks have seemed to increase in frequency and intensity. Associated symptoms include: diaphoresis and muscle  rigidity in her left arm.  ?She did previously receive psychotherapy from the [facility] health [facility] at the university.  ?Currently taking lexapro 20 mg daily and hydroxyzine 10mg  2 tablets TID prn for anxiety and depression. .  ? ? ?GU ?She was diagnosed with chlamydia in the past and reports completing treatment as prescribed.  ? ?HFU ?Evaluated in the ED on 10-15-2021 for worsening panic attacks,chest pain and left arm numbness. Head CT negative. EKG NSR. VSS. TSH was borderline low. She was discharged home with a temp supply of ativan to use as needed.  ?Lab Results  ?Component Value Date  ? TSH 0.347 (L) 10/15/2021  ?  ? ?Review of Systems  ?Constitutional:  Negative for fever, malaise/fatigue and weight loss.  ?HENT: Negative.  Negative for nosebleeds.   ?Eyes: Negative.  Negative for blurred vision, double vision and photophobia.  ?Respiratory: Negative.  Negative for cough and shortness of breath.   ?Cardiovascular: Negative.  Negative for chest pain, palpitations and leg swelling.  ?Gastrointestinal: Negative.  Negative for heartburn, nausea and vomiting.  ?Musculoskeletal: Negative.  Negative for myalgias.  ?Neurological: Negative.  Negative for dizziness, focal weakness, seizures and headaches.  ?Psychiatric/Behavioral:  Positive for depression. Negative for hallucinations, memory loss, substance abuse and suicidal ideas. The patient is nervous/anxious. The patient does not have insomnia.   ? ?Past Medical History:  ?Diagnosis Date  ? ADHD (attention deficit hyperactivity disorder) 12/13/2012  ? Followed by Dr. 12/15/2021: 03/29/2013 Rating scales do not  show need for stimulants, no rx given.   ? ? ?History reviewed. No pertinent surgical history. ? ?History reviewed. No pertinent family history. ? ?Social History Reviewed with no changes to be made today.  ? ?Outpatient Medications Prior to Visit  ?Medication Sig Dispense Refill  ? medroxyPROGESTERone (DEPO-PROVERA) 150 MG/ML injection Inject 1 mL (150 mg total)  into the muscle every 3 (three) months. 1 mL 4  ? escitalopram (LEXAPRO) 20 MG tablet Take 20 mg by mouth daily.    ? hydrOXYzine (ATARAX) 10 MG tablet Take 10 mg by mouth 3 (three) times daily as needed.    ? LORazepam (ATIVAN) 1 MG tablet Take 1 tablet when experiencing panic attack symptoms. Do not drive afterwards. 5 tablet 0  ? clindamycin-benzoyl peroxide (BENZACLIN) gel Apply topically once in the morning (Patient not taking: Reported on 11/28/2017) 50 g 0  ? DIFFERIN 0.1 % cream Apply topically at bedtime. (Patient not taking: Reported on 11/28/2017) 45 g 3  ? methylphenidate (CONCERTA) 18 MG PO CR tablet Take 1 tablet (18 mg total) by mouth daily. (Patient not taking: Reported on 09/27/2021) 30 tablet 0  ? methylphenidate (CONCERTA) 18 MG PO CR tablet Take 1 tablet (18 mg total) by mouth daily. (Patient not taking: Reported on 09/27/2021) 30 tablet 0  ? ?No facility-administered medications prior to visit.  ? ? ?Allergies  ?Allergen Reactions  ? Shellfish Allergy   ? ? ?   ?Objective:  ?  ?BP 105/72 (BP Location: Left Arm, Patient Position: Sitting, Cuff Size: Normal)   Pulse 65   Temp 98.7 ?F (37.1 ?C) (Oral)   Resp 16   Ht 5\' 5"  (1.651 m)   Wt 132 lb (59.9 kg)   LMP 10/20/2021 (Approximate)   SpO2 96%   BMI 21.97 kg/m?  ?Wt Readings from Last 3 Encounters:  ?10/27/21 132 lb (59.9 kg)  ?10/15/21 130 lb (59 kg)  ?09/27/21 127 lb 14.4 oz (58 kg)  ? ? ?Physical Exam ?Vitals and nursing note reviewed.  ?Constitutional:   ?   Appearance: She is well-developed.  ?HENT:  ?   Head: Normocephalic and atraumatic.  ?Cardiovascular:  ?   Rate and Rhythm: Normal rate and regular rhythm.  ?   Heart sounds: Normal heart sounds. No murmur heard. ?  No friction rub. No gallop.  ?Pulmonary:  ?   Effort: Pulmonary effort is normal. No tachypnea or respiratory distress.  ?   Breath sounds: Normal breath sounds. No decreased breath sounds, wheezing, rhonchi or rales.  ?Chest:  ?   Chest wall: No tenderness.  ?Abdominal:  ?    General: Bowel sounds are normal.  ?   Palpations: Abdomen is soft.  ?Musculoskeletal:     ?   General: Normal range of motion.  ?   Cervical back: Normal range of motion.  ?Skin: ?   General: Skin is warm and dry.  ?Neurological:  ?   Mental Status: She is alert and oriented to person, place, and time.  ?   Coordination: Coordination normal.  ?Psychiatric:     ?   Behavior: Behavior normal. Behavior is cooperative.     ?   Thought Content: Thought content normal.     ?   Judgment: Judgment normal.  ? ? ? ? ?   ?Patient has been counseled extensively about nutrition and exercise as well as the importance of adherence with medications and regular follow-up. The patient was given clear instructions to go to ER or return  to medical center if symptoms don't improve, worsen or new problems develop. The patient verbalized understanding.  ? ?Follow-up: Return for Has appt with me on July 10th. .  ? ?Claiborne Rigg, FNP-BC ?[facility] ?[place], [place] ?[phone]   ?10/27/2021, 8:44 PM ?

## 2021-10-27 NOTE — Progress Notes (Signed)
HFU-  ?Thyroid and anxiety ?

## 2021-10-28 LAB — CERVICOVAGINAL ANCILLARY ONLY
Bacterial Vaginitis (gardnerella): NEGATIVE
Candida Glabrata: NEGATIVE
Candida Vaginitis: NEGATIVE
Chlamydia: NEGATIVE
Comment: NEGATIVE
Comment: NEGATIVE
Comment: NEGATIVE
Comment: NEGATIVE
Comment: NEGATIVE
Comment: NORMAL
Neisseria Gonorrhea: NEGATIVE
Trichomonas: NEGATIVE

## 2021-10-28 LAB — HIV ANTIBODY (ROUTINE TESTING W REFLEX): HIV Screen 4th Generation wRfx: NONREACTIVE

## 2021-11-17 DIAGNOSIS — F902 Attention-deficit hyperactivity disorder, combined type: Secondary | ICD-10-CM | POA: Diagnosis not present

## 2021-11-17 DIAGNOSIS — F411 Generalized anxiety disorder: Secondary | ICD-10-CM | POA: Diagnosis not present

## 2021-11-17 DIAGNOSIS — F4011 Social phobia, generalized: Secondary | ICD-10-CM | POA: Diagnosis not present

## 2021-11-17 DIAGNOSIS — F3341 Major depressive disorder, recurrent, in partial remission: Secondary | ICD-10-CM | POA: Diagnosis not present

## 2021-12-20 ENCOUNTER — Encounter: Payer: Self-pay | Admitting: Nurse Practitioner

## 2021-12-20 ENCOUNTER — Ambulatory Visit (INDEPENDENT_AMBULATORY_CARE_PROVIDER_SITE_OTHER): Payer: Medicaid Other

## 2021-12-20 ENCOUNTER — Ambulatory Visit: Payer: Medicaid Other | Attending: Nurse Practitioner | Admitting: Nurse Practitioner

## 2021-12-20 VITALS — BP 108/74 | HR 67 | Ht 65.0 in | Wt 139.6 lb

## 2021-12-20 VITALS — BP 110/66 | HR 73 | Ht 65.0 in | Wt 139.0 lb

## 2021-12-20 DIAGNOSIS — Z0000 Encounter for general adult medical examination without abnormal findings: Secondary | ICD-10-CM

## 2021-12-20 DIAGNOSIS — J302 Other seasonal allergic rhinitis: Secondary | ICD-10-CM | POA: Diagnosis not present

## 2021-12-20 DIAGNOSIS — F419 Anxiety disorder, unspecified: Secondary | ICD-10-CM

## 2021-12-20 DIAGNOSIS — R7989 Other specified abnormal findings of blood chemistry: Secondary | ICD-10-CM | POA: Diagnosis not present

## 2021-12-20 DIAGNOSIS — F32A Depression, unspecified: Secondary | ICD-10-CM

## 2021-12-20 DIAGNOSIS — F902 Attention-deficit hyperactivity disorder, combined type: Secondary | ICD-10-CM

## 2021-12-20 DIAGNOSIS — Z3042 Encounter for surveillance of injectable contraceptive: Secondary | ICD-10-CM | POA: Diagnosis not present

## 2021-12-20 MED ORDER — METHYLPHENIDATE HCL ER (OSM) 18 MG PO TBCR: 18.0000 mg | EXTENDED_RELEASE_TABLET | Freq: Every day | ORAL | 0 refills | Status: DC

## 2021-12-20 MED ORDER — MEDROXYPROGESTERONE ACETATE 150 MG/ML IM SUSP
150.0000 mg | Freq: Once | INTRAMUSCULAR | Status: AC
  Administered 2021-12-20: 150 mg via INTRAMUSCULAR

## 2021-12-20 MED ORDER — LORATADINE 10 MG PO TABS: 10.0000 mg | ORAL_TABLET | Freq: Every day | ORAL | 11 refills | Status: DC

## 2021-12-20 NOTE — Progress Notes (Signed)
SUBJECTIVE: Emily Maynard is a 21 y.o. female who presents for DEPO Injection BC.   OBJECTIVE: Appears well, in no apparent distress.  Vital signs are normal.     ASSESSMENT: Need for BC.  Last PAP 09/27/2021  PLAN: DEPO Injection given in RD, tolerated well.  Next DEPO due Sept. 25 - Oct. 9, 2023  Administrations This Visit     medroxyPROGESTERone (DEPO-PROVERA) injection 150 mg     Admin Date 12/20/2021 Action Given Dose 150 mg Route Intramuscular Administered By Maretta Bees, RMA

## 2021-12-20 NOTE — Progress Notes (Signed)
Assessment & Plan:  Emily Maynard was seen today for annual exam.  Diagnoses and all orders for this visit:  Encounter for annual physical exam  Low TSH level -     Thyroid Panel With TSH  Anxiety and depression  Seasonal allergies -     loratadine (CLARITIN) 10 MG tablet; Take 1 tablet (10 mg total) by mouth daily.  ADHD (attention deficit hyperactivity disorder), combined type -     methylphenidate (CONCERTA) 18 MG PO CR tablet; Take 1 tablet (18 mg total) by mouth daily. COURTESY 30day refill only. She will obtain future refills from her [facility]    Patient has been counseled on age-appropriate routine health concerns for screening and prevention. These are reviewed and up-to-date. Referrals have been placed accordingly. Immunizations are up-to-date or declined.    Subjective:   Chief Complaint  Patient presents with   Annual Exam   HPI Emily Maynard 21 y.o. female presents to office today for annual exam She  has a past medical history of ADHD (12/13/2012).   Review of Systems  Constitutional:  Negative for fever, malaise/fatigue and weight loss.  HENT: Negative.  Negative for nosebleeds.   Eyes: Negative.  Negative for blurred vision, double vision and photophobia.  Respiratory: Negative.  Negative for cough and shortness of breath.   Cardiovascular: Negative.  Negative for chest pain, palpitations and leg swelling.  Gastrointestinal: Negative.  Negative for heartburn, nausea and vomiting.  Genitourinary: Negative.   Musculoskeletal: Negative.  Negative for myalgias.  Skin:  Positive for itching.  Neurological: Negative.  Negative for dizziness, focal weakness, seizures and headaches.  Endo/Heme/Allergies:  Positive for environmental allergies.  Psychiatric/Behavioral: Negative.  Negative for suicidal ideas.     Past Medical History:  Diagnosis Date   ADHD (attention deficit hyperactivity disorder) 12/13/2012   Followed by Dr. Inda Coke: 03/29/2013 Rating scales do not  show need for stimulants, no rx given.     History reviewed. No pertinent surgical history.  History reviewed. No pertinent family history.  Social History Reviewed with no changes to be made today.   Outpatient Medications Prior to Visit  Medication Sig Dispense Refill   escitalopram (LEXAPRO) 20 MG tablet Take 1 tablet (20 mg total) by mouth daily. 90 tablet 1   hydrOXYzine (ATARAX) 25 MG tablet Take 1 tablet (25 mg total) by mouth 3 (three) times daily as needed. 90 tablet 3   medroxyPROGESTERone (DEPO-PROVERA) 150 MG/ML injection Inject 1 mL (150 mg total) into the muscle every 3 (three) months. 1 mL 4   methylphenidate (CONCERTA) 18 MG PO CR tablet Take 1 tablet (18 mg total) by mouth daily. 90 tablet 0   No facility-administered medications prior to visit.    Allergies  Allergen Reactions   Shellfish Allergy        Objective:    BP 108/74   Pulse 67   Ht 5\' 5"  (1.651 m)   Wt 139 lb 9.6 oz (63.3 kg)   SpO2 93%   BMI 23.23 kg/m  Wt Readings from Last 3 Encounters:  12/20/21 139 lb 9.6 oz (63.3 kg)  10/27/21 132 lb (59.9 kg)  10/15/21 130 lb (59 kg)    Physical Exam Constitutional:      Appearance: She is well-developed.  HENT:     Head: Normocephalic and atraumatic.     Right Ear: Hearing, tympanic membrane, ear canal and external ear normal.     Left Ear: Hearing, tympanic membrane, ear canal and external ear normal.  Nose: Nose normal.     Right Turbinates: Not enlarged.     Left Turbinates: Not enlarged.     Mouth/Throat:     Lips: Pink.     Mouth: Mucous membranes are moist.     Dentition: No dental tenderness, gingival swelling, dental abscesses or gum lesions.     Pharynx: No oropharyngeal exudate.  Eyes:     General: No scleral icterus.       Right eye: No discharge.     Extraocular Movements: Extraocular movements intact.     Conjunctiva/sclera: Conjunctivae normal.     Pupils: Pupils are equal, round, and reactive to light.  Neck:      Thyroid: No thyromegaly.     Trachea: No tracheal deviation.  Cardiovascular:     Rate and Rhythm: Normal rate and regular rhythm.     Heart sounds: Normal heart sounds. No murmur heard.    No friction rub.  Pulmonary:     Effort: Pulmonary effort is normal. No accessory muscle usage or respiratory distress.     Breath sounds: Normal breath sounds. No decreased breath sounds, wheezing, rhonchi or rales.  Abdominal:     General: Bowel sounds are normal. There is no distension.     Palpations: Abdomen is soft. There is no mass.     Tenderness: There is no abdominal tenderness. There is no right CVA tenderness, left CVA tenderness, guarding or rebound.     Hernia: No hernia is present.  Musculoskeletal:        General: No tenderness or deformity. Normal range of motion.     Cervical back: Normal range of motion and neck supple.  Lymphadenopathy:     Cervical: No cervical adenopathy.  Skin:    General: Skin is warm and dry.     Findings: No erythema.  Neurological:     Mental Status: She is alert and oriented to person, place, and time.     Cranial Nerves: No cranial nerve deficit.     Motor: Motor function is intact.     Coordination: Coordination is intact. Coordination normal.     Gait: Gait is intact.     Deep Tendon Reflexes:     Reflex Scores:      Patellar reflexes are 1+ on the right side and 1+ on the left side. Psychiatric:        Attention and Perception: Attention normal.        Mood and Affect: Mood normal.        Speech: Speech normal.        Behavior: Behavior normal.        Thought Content: Thought content normal.        Judgment: Judgment normal.          Patient has been counseled extensively about nutrition and exercise as well as the importance of adherence with medications and regular follow-up. The patient was given clear instructions to go to ER or return to medical center if symptoms don't improve, worsen or new problems develop. The patient verbalized  understanding.   Follow-up: No follow-ups on file.   Claiborne Rigg, FNP-BC [facility] [place], [place] [phone]   12/20/2021, 10:46 AM

## 2021-12-21 LAB — THYROID PANEL WITH TSH
Free Thyroxine Index: 2.3 (ref 1.2–4.9)
T3 Uptake Ratio: 29 % (ref 24–39)
T4, Total: 7.8 ug/dL (ref 4.5–12.0)
TSH: 0.747 u[IU]/mL (ref 0.450–4.500)

## 2022-01-04 NOTE — Progress Notes (Signed)
Patient was assessed and managed by nursing staff during this encounter. I have reviewed the chart and agree with the documentation and plan. I have also made any necessary editorial changes.  Scheryl Darter, MD 01/04/2022 2:58 PM

## 2022-01-15 ENCOUNTER — Encounter: Payer: Self-pay | Admitting: Advanced Practice Midwife

## 2022-02-11 DIAGNOSIS — F3341 Major depressive disorder, recurrent, in partial remission: Secondary | ICD-10-CM | POA: Diagnosis not present

## 2022-02-11 DIAGNOSIS — F411 Generalized anxiety disorder: Secondary | ICD-10-CM | POA: Diagnosis not present

## 2022-02-11 DIAGNOSIS — F902 Attention-deficit hyperactivity disorder, combined type: Secondary | ICD-10-CM | POA: Diagnosis not present

## 2022-03-01 ENCOUNTER — Ambulatory Visit (INDEPENDENT_AMBULATORY_CARE_PROVIDER_SITE_OTHER): Payer: Medicaid Other

## 2022-03-01 VITALS — BP 108/70 | HR 75 | Ht 65.0 in | Wt 149.0 lb

## 2022-03-01 DIAGNOSIS — N921 Excessive and frequent menstruation with irregular cycle: Secondary | ICD-10-CM

## 2022-03-01 MED ORDER — ESTRADIOL 0.0375 MG/24HR TD PTWK: 0.0375 mg | MEDICATED_PATCH | TRANSDERMAL | 3 refills | Status: DC

## 2022-03-01 NOTE — Progress Notes (Signed)
   GYNECOLOGY PROBLEM OFFICE VISIT NOTE  History:  Emily Maynard is a 21 y.o. No obstetric history on file. here today for dysfunctional uterine bleeding on depo. She states her first injection was in June and she has been having a regular period with daily spotting since. She reports her menses is usually 4-5 days with moderate flow, cramping and lower back pain with ibuprofen and heating pad for relief. Clots the size of grapes.  She reports having dark brown blood between periods that require pad and tampon usage. Clots that are small like pomegranate seeds. Some cramping.  She reports that she loves the ease of use with depo and wishes to continue the method.  She denies any abnormal vaginal discharge, pelvic pain or other concerns.   Past Medical History:  Diagnosis Date   ADHD (attention deficit hyperactivity disorder) 12/13/2012   Followed by Dr. Quentin Cornwall: 03/29/2013 Rating scales do not show need for stimulants, no rx given.     No past surgical history on file.  The following portions of the patient's history were reviewed and updated as appropriate: allergies, current medications, past family history, past medical history, past social history, past surgical history and problem list.   Health Maintenance:  Normal pap on April 2023.  No mammogram on file d/t age.   Review of Systems:  Genito-Urinary ROS: no dysuria, trouble voiding, or hematuria Gastrointestinal ROS: negative Objective:  Vitals: BP 108/70   Pulse 75   Ht 5\' 5"  (1.651 m)   Wt 149 lb (67.6 kg)   LMP 02/22/2022 (Exact Date)   BMI 24.79 kg/m   Physical Exam: Physical Exam Constitutional:      General: She is not in acute distress.    Appearance: Normal appearance. She is not ill-appearing.  HENT:     Head: Normocephalic and atraumatic.  Eyes:     Conjunctiva/sclera: Conjunctivae normal.  Cardiovascular:     Rate and Rhythm: Normal rate.  Pulmonary:     Effort: Pulmonary effort is normal. No respiratory  distress.  Musculoskeletal:        General: Normal range of motion.     Cervical back: Normal range of motion.  Neurological:     Mental Status: She is alert and oriented to person, place, and time.  Psychiatric:        Mood and Affect: Mood normal.        Behavior: Behavior normal.  Vitals reviewed.      Labs and Imaging: No results found.  Assessment & Plan:  21 year old Breakthrough Bleeding on Depo Provera  -Reassured that no uncommon to have some irregular/dysfunction bleeding during first 3 depo provera injections.  -Discussed treating with additional estrogen to help with maintaining uterine lining.  -Patient given option of oral medication or patch.  States she does not do well with taking pills. -Climara patch sent to pharmacy on file. Plan to take for 3 months.  -Unable to complete injection today d/t patient not bringing medication. Okay to return later today or as scheduled.    Total face-to-face time with patient: 15 minutes   Gavin Pound, [place] 03/01/2022 9:13 AM

## 2022-03-01 NOTE — Progress Notes (Signed)
21 y.o GYN presents for  [facility], she is currently on DEPO. C/o of bleeding.  Next Injection is due 9/25-10/02/2022.  Last PAP 09/27/2021

## 2022-03-11 ENCOUNTER — Ambulatory Visit: Payer: Medicaid Other

## 2022-03-15 ENCOUNTER — Ambulatory Visit: Payer: Medicaid Other

## 2022-03-15 DIAGNOSIS — F411 Generalized anxiety disorder: Secondary | ICD-10-CM | POA: Diagnosis not present

## 2022-03-15 DIAGNOSIS — Z3042 Encounter for surveillance of injectable contraceptive: Secondary | ICD-10-CM | POA: Diagnosis not present

## 2022-03-15 DIAGNOSIS — R079 Chest pain, unspecified: Secondary | ICD-10-CM | POA: Diagnosis not present

## 2022-03-26 ENCOUNTER — Telehealth: Payer: Medicaid Other | Admitting: Physician Assistant

## 2022-03-26 DIAGNOSIS — N898 Other specified noninflammatory disorders of vagina: Secondary | ICD-10-CM | POA: Diagnosis not present

## 2022-03-26 DIAGNOSIS — R3989 Other symptoms and signs involving the genitourinary system: Secondary | ICD-10-CM | POA: Diagnosis not present

## 2022-03-26 MED ORDER — CEPHALEXIN 500 MG PO CAPS: 500.0000 mg | ORAL_CAPSULE | Freq: Two times a day (BID) | ORAL | 0 refills | Status: DC

## 2022-03-26 MED ORDER — FLUCONAZOLE 150 MG PO TABS: 150.0000 mg | ORAL_TABLET | ORAL | 0 refills | Status: DC | PRN

## 2022-03-26 NOTE — Progress Notes (Signed)
E-Visit for Urinary Problems  We are sorry that you are not feeling well.  Here is how we plan to help!  Based on what you shared with me it looks like you most likely have a simple urinary tract infection.  A UTI (Urinary Tract Infection) is a bacterial infection of the bladder.  Most cases of urinary tract infections are simple to treat but a key part of your care is to encourage you to drink plenty of fluids and watch your symptoms carefully.  I have prescribed Keflex 500 mg twice a day for 7 days.  Your symptoms should gradually improve. Call us if the burning in your urine worsens, you develop worsening fever, back pain or pelvic pain or if your symptoms do not resolve after completing the antibiotic. I have also prescribed Diflucan for yeast infection.  Urinary tract infections can be prevented by drinking plenty of water to keep your body hydrated.  Also be sure when you wipe, wipe from front to back and don't hold it in!  If possible, empty your bladder every 4 hours.  HOME CARE Drink plenty of fluids Compete the full course of the antibiotics even if the symptoms resolve Remember, when you need to go.go. Holding in your urine can increase the likelihood of getting a UTI! GET HELP RIGHT AWAY IF: You cannot urinate You get a high fever Worsening back pain occurs You see blood in your urine You feel sick to your stomach or throw up You feel like you are going to pass out  MAKE SURE YOU  Understand these instructions. Will watch your condition. Will get help right away if you are not doing well or get worse.   Thank you for choosing an e-visit.  Your e-visit answers were reviewed by a board certified advanced clinical practitioner to complete your personal care plan. Depending upon the condition, your plan could have included both over the counter or prescription medications.  Please review your pharmacy choice. Make sure the pharmacy is open so you can pick up prescription  now. If there is a problem, you may contact your provider through [employer] and have the prescription routed to another pharmacy.  Your safety is important to [place]. If you have drug allergies check your prescription carefully.   For the next 24 hours you can use MyChart to ask questions about today's visit, request a non-urgent call back, or ask for a work or school excuse. You will get an email in the next two days asking about your experience. I hope that your e-visit has been valuable and will speed your recovery.  I have spent 5 minutes in review of e-visit questionnaire, review and updating patient chart, medical decision making and response to patient.   Mar Daring, PA-C

## 2022-05-07 ENCOUNTER — Emergency Department (HOSPITAL_COMMUNITY): Payer: Medicaid Other

## 2022-05-07 ENCOUNTER — Emergency Department (HOSPITAL_COMMUNITY)
Admission: EM | Admit: 2022-05-07 | Discharge: 2022-05-07 | Disposition: A | Discharge status: Home / Self Care | Payer: Medicaid Other | Attending: Emergency Medicine | Admitting: Emergency Medicine

## 2022-05-07 ENCOUNTER — Other Ambulatory Visit: Payer: Self-pay

## 2022-05-07 ENCOUNTER — Encounter (HOSPITAL_COMMUNITY): Payer: Self-pay

## 2022-05-07 DIAGNOSIS — J069 Acute upper respiratory infection, unspecified: Secondary | ICD-10-CM | POA: Insufficient documentation

## 2022-05-07 DIAGNOSIS — Z20822 Contact with and (suspected) exposure to covid-19: Secondary | ICD-10-CM | POA: Insufficient documentation

## 2022-05-07 DIAGNOSIS — R0981 Nasal congestion: Secondary | ICD-10-CM | POA: Diagnosis present

## 2022-05-07 HISTORY — DX: Anxiety disorder, unspecified: F41.9

## 2022-05-07 LAB — RESP PANEL BY RT-PCR (FLU A&B, COVID) ARPGX2
Influenza A by PCR: NEGATIVE
Influenza B by PCR: NEGATIVE
SARS Coronavirus 2 by RT PCR: NEGATIVE

## 2022-05-07 LAB — GROUP A STREP BY PCR: Group A Strep by PCR: NOT DETECTED

## 2022-05-07 MED ORDER — ONDANSETRON HCL 4 MG PO TABS: 4.0000 mg | ORAL_TABLET | Freq: Four times a day (QID) | ORAL | 0 refills | Status: DC | PRN

## 2022-05-07 NOTE — ED Provider Notes (Addendum)
[facility]-EMERGENCY DEPT Provider Note   CSN: [id number] Arrival date & time: 05/07/22  1223     History  Chief Complaint  Patient presents with   Sore Throat   Cough   Chills   Headache   Nasal Congestion    Emily Maynard is a 21 y.o. female with medical history of ADHD, anxiety.  Patient presents to ED for evaluation of URI symptoms.  Patient states that 3 days ago she developed nasal congestion, headache, bodyaches and chills, cough and sore throat.  The patient reports that she has been taking no medications at home to treat her symptoms.  The patient denies any known sick contacts.  Patient denies being around any children.  The patient reports that she works at [employer] on night shift however denies any known illnesses going around her workplace.  The patient denies any trouble swallowing, vomiting, diarrhea, abdominal pain.  The patient is endorsing episode of nausea this morning however denies any recurrence of this.   Sore Throat Associated symptoms include headaches. Pertinent negatives include no abdominal pain.  Cough Associated symptoms: chills, headaches and sore throat   Associated symptoms: no fever   Headache Associated symptoms: congestion, cough, nausea and sore throat   Associated symptoms: no abdominal pain, no diarrhea, no fever and no vomiting        Home Medications Prior to Admission medications   Medication Sig Start Date End Date Taking? Authorizing Provider  ondansetron (ZOFRAN) 4 MG tablet Take 1 tablet (4 mg total) by mouth every 6 (six) hours as needed for nausea or vomiting. 05/07/22  Yes Al Decant, PA-C  cephALEXin (KEFLEX) 500 MG capsule Take 1 capsule (500 mg total) by mouth 2 (two) times daily. 03/26/22   Margaretann Loveless, PA-C  escitalopram (LEXAPRO) 20 MG tablet Take 1 tablet (20 mg total) by mouth daily. 10/27/21 04/25/22  Claiborne Rigg, NP  estradiol (CLIMARA - DOSED IN MG/24 HR) 0.0375 mg/24hr patch  Place 1 patch (0.0375 mg total) onto the skin once a week. 03/01/22   Gerrit Heck, CNM  fluconazole (DIFLUCAN) 150 MG tablet Take 1 tablet (150 mg total) by mouth every 3 (three) days as needed. 03/26/22   Margaretann Loveless, PA-C  hydrOXYzine (ATARAX) 25 MG tablet Take 1 tablet (25 mg total) by mouth 3 (three) times daily as needed. 10/27/21   Claiborne Rigg, NP  loratadine (CLARITIN) 10 MG tablet Take 1 tablet (10 mg total) by mouth daily. 12/20/21   Claiborne Rigg, NP  medroxyPROGESTERone (DEPO-PROVERA) 150 MG/ML injection Inject 1 mL (150 mg total) into the muscle every 3 (three) months. 09/27/21   Leftwich-Kirby, Wilmer Floor, CNM  methylphenidate (CONCERTA) 18 MG PO CR tablet Take 1 tablet (18 mg total) by mouth daily. 12/20/21   Claiborne Rigg, NP      Allergies    Shellfish allergy    Review of Systems   Review of Systems  Constitutional:  Positive for chills. Negative for fever.  HENT:  Positive for congestion and sore throat. Negative for trouble swallowing.   Respiratory:  Positive for cough.   Gastrointestinal:  Positive for nausea. Negative for abdominal pain, diarrhea and vomiting.  Neurological:  Positive for headaches.  All other systems reviewed and are negative.   Physical Exam Updated Vital Signs BP 122/76   Pulse 79   Temp 98.5 F (36.9 C) (Oral)   Resp 16   Ht 5\' 5"  (1.651 m)   Wt 63.5 kg  LMP 04/23/2022   SpO2 100%   BMI 23.30 kg/m  Physical Exam Vitals and nursing note reviewed.  Constitutional:      General: She is not in acute distress.    Appearance: Normal appearance. She is not ill-appearing, toxic-appearing or diaphoretic.  HENT:     Head: Normocephalic and atraumatic.     Nose: No congestion.     Mouth/Throat:     Mouth: Mucous membranes are moist.     Pharynx: Oropharynx is clear. Posterior oropharyngeal erythema present. No oropharyngeal exudate or uvula swelling.     Tonsils: No tonsillar exudate or tonsillar abscesses. 0 on the right. 0  on the left.  Eyes:     Extraocular Movements: Extraocular movements intact.     Conjunctiva/sclera: Conjunctivae normal.     Pupils: Pupils are equal, round, and reactive to light.  Cardiovascular:     Rate and Rhythm: Regular rhythm. Tachycardia present.  Pulmonary:     Effort: Pulmonary effort is normal.     Breath sounds: Normal breath sounds. No wheezing.  Abdominal:     General: Abdomen is flat. Bowel sounds are normal.     Palpations: Abdomen is soft.     Tenderness: There is no abdominal tenderness.  Musculoskeletal:     Cervical back: Normal range of motion and neck supple. No tenderness.  Skin:    General: Skin is warm and dry.     Capillary Refill: Capillary refill takes less than 2 seconds.  Neurological:     Mental Status: She is alert and oriented to person, place, and time.     ED Results / Procedures / Treatments   Labs (all labs ordered are listed, but only abnormal results are displayed) Labs Reviewed  RESP PANEL BY RT-PCR (FLU A&B, COVID) [id number]  GROUP A STREP BY PCR    EKG None  Radiology No results found.  Procedures Procedures   Medications Ordered in ED Medications - No data to display  ED Course/ Medical Decision Making/ A&P                           Medical Decision Making Risk Prescription drug management.   21 year old female presents to ED for evaluation.  Please see HPI for further details.  On initial examination the patient is tachycardic to 117, afebrile.  Patient lung sounds clear bilaterally, not hypoxic.  Patient abdomen soft and compressible throughout.  Patient posterior oropharynx does have erythema without exudate.  Uvula midline.  No drooling, no trismus.  Patient handling secretions appropriately.  Patient respiratory panel negative.  The patient group A strep is negative.  The patient chest x-ray shows no consolidations, effusions.  Patient noted to be tachycardic however after discussing with attending Dr. Silverio Lay we  feel that this patient is suitable for discharge.  I went and reassessed patient vital signs and she was shown to have a blood pressure of 122/76, pulse rate of 67.  At this time, slightly because of patient symptoms due to viral illness.  The patient will be advised to treat symptoms at home conservatively.  Patient will be encouraged to return to the ED with any new or worsening signs or symptoms.  Patient had all her questions answered to her satisfaction.  The patient was sent home with Zofran for nausea.  Patient stable for discharge.  Final Clinical Impression(s) / ED Diagnoses Final diagnoses:  Viral URI with cough    Rx / DC Orders ED  Discharge Orders          Ordered    ondansetron (ZOFRAN) 4 MG tablet  Every 6 hours PRN        05/07/22 1702               Clent Ridges 05/07/22 1710    Charlynne Pander, MD 05/07/22 1729

## 2022-05-07 NOTE — ED Triage Notes (Signed)
Patient c/o sore throat, nasal congestion, chills, headache, and a productive cough with yellow sputum x 3 days.

## 2022-05-07 NOTE — ED Provider Triage Note (Signed)
Emergency Medicine Provider Triage Evaluation Note  Emily Maynard , a 21 y.o. female  was evaluated in triage.  Pt complains of a sore throat.  Pt reports she had a sore throat 2 weeks ago.     Review of Systems  Positive: fever Negative: Abdominal pain  Physical Exam  BP 101/74   Pulse (!) 117   Temp 98.4 F (36.9 C) (Oral)   Resp 17   Ht 5\' 5"  (1.651 m)   Wt 63.5 kg   SpO2 91%   BMI 23.30 kg/m  Gen:   Awake, no distress   Errythema throat  Resp:  Normal effort  MSK:   Moves extremities without difficulty  Other:    Medical Decision Making  Medically screening exam initiated at 1:51 PM.  Appropriate orders placed.  Emily Maynard was informed that the remainder of the evaluation will be completed by another provider, this initial triage assessment does not replace that evaluation, and the importance of remaining in the ED until their evaluation is complete.     Emily Maynard, Elson Areas 05/07/22 1353

## 2022-05-07 NOTE — Discharge Instructions (Addendum)
Return to the ED with any new or worsening signs or symptoms Please take ibuprofen for body aches and chills.  Please take Tylenol for headache and fevers.  Please take Zofran for nausea. Please read attached guide concerning upper respiratory infections Please continue pushing fluids to include Body Armor, Pedialyte, electrolyte supplementation beverages Please eat a soft diet to include bananas, rice, applesauce, toast, chicken no soup, yogurt Please see attached work note

## 2022-05-30 ENCOUNTER — Ambulatory Visit (INDEPENDENT_AMBULATORY_CARE_PROVIDER_SITE_OTHER): Payer: Medicaid Other | Admitting: Family Medicine

## 2022-05-30 ENCOUNTER — Other Ambulatory Visit (HOSPITAL_COMMUNITY)
Admission: RE | Admit: 2022-05-30 | Discharge: 2022-05-30 | Disposition: A | Discharge status: Home / Self Care | Payer: Medicaid Other | Source: Ambulatory Visit | Attending: Family Medicine | Admitting: Family Medicine

## 2022-05-30 ENCOUNTER — Encounter: Payer: Self-pay | Admitting: Family Medicine

## 2022-05-30 VITALS — BP 118/69 | HR 75 | Ht 65.0 in | Wt 156.0 lb

## 2022-05-30 DIAGNOSIS — Z113 Encounter for screening for infections with a predominantly sexual mode of transmission: Secondary | ICD-10-CM | POA: Insufficient documentation

## 2022-05-30 DIAGNOSIS — Z30017 Encounter for initial prescription of implantable subdermal contraceptive: Secondary | ICD-10-CM | POA: Diagnosis not present

## 2022-05-30 DIAGNOSIS — Z01812 Encounter for preprocedural laboratory examination: Secondary | ICD-10-CM

## 2022-05-30 DIAGNOSIS — Z3042 Encounter for surveillance of injectable contraceptive: Secondary | ICD-10-CM | POA: Diagnosis not present

## 2022-05-30 LAB — POCT URINE PREGNANCY: Preg Test, Ur: NEGATIVE

## 2022-05-30 MED ORDER — ETONOGESTREL 68 MG ~~LOC~~ IMPL
68.0000 mg | DRUG_IMPLANT | Freq: Once | SUBCUTANEOUS | Status: AC
  Administered 2022-05-30: 68 mg via SUBCUTANEOUS

## 2022-05-30 NOTE — Progress Notes (Unsigned)
GYNECOLOGY OFFICE VISIT NOTE  History:   Emily Maynard is a 21 y.o. No obstetric history on file. here today for contraceptive management. Has been on the depo. But having irregular bleeding. Tried the patch a few months back to see if it will help regulate her bleeding, but she states that the patches are not sticking on and bleeding remains irregular. Would like to try the nexplanon. She is due for the next depo shot. She denies any abnormal vaginal discharge, pelvic pain or other concerns.    Past Medical History:  Diagnosis Date   ADHD (attention deficit hyperactivity disorder) 12/13/2012   Followed by Dr. Inda Coke: 03/29/2013 Rating scales do not show need for stimulants, no rx given.    Anxiety     History reviewed. No pertinent surgical history.  The following portions of the patient's history were reviewed and updated as appropriate: allergies, current medications, past family history, past medical history, past social history, past surgical history and problem list.   Health Maintenance:  UTD on pap.  Review of Systems:  Pertinent items noted in HPI and remainder of comprehensive ROS otherwise negative.  Physical Exam:  BP 118/69   Pulse 75   Ht 5\' 5"  (1.651 m)   Wt 156 lb (70.8 kg)   LMP 04/23/2022   BMI 25.96 kg/m  CONSTITUTIONAL: Well-developed, well-nourished female in no acute distress.  HEENT:  Normocephalic, atraumatic. External right and left ear normal. No scleral icterus.  PSYCHIATRIC: Normal mood and affect. Normal behavior. Normal judgment and thought content. CARDIOVASCULAR: Normal heart rate noted RESPIRATORY: Effort and breath sounds normal, no problems with respiration noted ABDOMEN: No masses noted. No other overt distention noted.   PELVIC: Deferred  Labs and Imaging No results found for this or any previous visit (from the past 168 hour(s)). DG Chest 2 View  Result Date: 05/07/2022 CLINICAL DATA:  Cough EXAM: CHEST - 2 VIEW COMPARISON:   03/19/2021 FINDINGS: The heart size and mediastinal contours are within normal limits. Both lungs are clear. The visualized skeletal structures are unremarkable. IMPRESSION: No active cardiopulmonary disease. Electronically Signed   By: 05/19/2021 M.D.   On: 05/07/2022 16:10      Assessment and Plan:  Encounter for surveillance of injectable contraceptive  Routine screening for STI (sexually transmitted infection) - Plan: Hepatitis B surface antigen, Hepatitis C antibody, HIV Antibody (routine testing w rflx), RPR, Cervicovaginal ancillary only( [place])  Nexplanon insertion - Plan: POCT urine pregnancy   Nexplanon Insertion Procedure Note  Patient was identified. Informed consent was signed, signed copy in chart. A time-out was performed.    The insertion site was identified 8-10 cm (3-4 inches) from the medial epicondyle of the humerus and 3-5 cm (1.25-2 inches) posterior to (below) the sulcus (groove) between the biceps and triceps muscles of the patient's left arm and marked. The site was prepped and draped in the usual sterile fashion. Pt was prepped with alcohol swab and then injected with 4 cc of 2% lidocaine. The site was prepped with betadine. Nexplanon removed form packaging,  Device confirmed in needle, then inserted full length of needle and withdrawn per handbook instructions. Provider and patient verified presence of the implant in the woman's arm by palpation. Pt insertion site was covered with steristrips/adhesive bandage and pressure bandage. There was minimal blood loss. Patient tolerated procedure well.  Patient was given post procedure instructions and Nexplanon user card with expiration date. Condoms were recommended for STI prevention. Patient was asked to keep  the pressure dressing on for 24 hours to minimize bruising and keep the adhesive bandage on for 3-5 days. The patient verbalized understanding of the plan of care and agrees.    I spent 20 minutes dedicated  to the care of this patient including pre-visit review of records, face to face time with the patient discussing her conditions and treatments and post visit orders.   Sheppard Evens MD MPH OB Fellow, Faculty Practice [facility], [facility] 05/30/2022

## 2022-05-30 NOTE — Progress Notes (Signed)
Pt presents for bleeding while on Depo. Pt states Depo April 2023. Last Depo 03-01-22, due now. Pt was given birth control patches to regulate bleeding 02/2022. Pt interested in Nexplanon.   Pt requesting STD testing.

## 2022-05-31 LAB — HIV ANTIBODY (ROUTINE TESTING W REFLEX): HIV Screen 4th Generation wRfx: NONREACTIVE

## 2022-05-31 LAB — HEPATITIS C ANTIBODY: Hep C Virus Ab: NONREACTIVE

## 2022-05-31 LAB — HEPATITIS B SURFACE ANTIGEN: Hepatitis B Surface Ag: NEGATIVE

## 2022-05-31 LAB — RPR: RPR Ser Ql: NONREACTIVE

## 2022-06-01 LAB — CERVICOVAGINAL ANCILLARY ONLY
Bacterial Vaginitis (gardnerella): NEGATIVE
Candida Glabrata: NEGATIVE
Candida Vaginitis: NEGATIVE
Chlamydia: NEGATIVE
Comment: NEGATIVE
Comment: NEGATIVE
Comment: NEGATIVE
Comment: NEGATIVE
Comment: NEGATIVE
Comment: NORMAL
Neisseria Gonorrhea: NEGATIVE
Trichomonas: NEGATIVE

## 2022-06-05 ENCOUNTER — Encounter: Payer: Self-pay | Admitting: Family Medicine

## 2022-07-03 ENCOUNTER — Other Ambulatory Visit: Payer: Self-pay | Admitting: Nurse Practitioner

## 2022-07-03 DIAGNOSIS — F32A Depression, unspecified: Secondary | ICD-10-CM

## 2022-07-04 NOTE — Telephone Encounter (Signed)
Requested Prescriptions  Pending Prescriptions Disp Refills   escitalopram (LEXAPRO) 20 MG tablet [Pharmacy Med Name: ESCITALOPRAM 20 MG TABLET] 90 tablet 0    Sig: TAKE 1 TABLET BY MOUTH EVERY DAY     Psychiatry:  Antidepressants - SSRI Failed - 07/03/2022 12:50 AM      Failed - Valid encounter within last 6 months    Recent Outpatient Visits           6 months ago Encounter for annual physical exam   [facility] [place], Vernia Buff, NP   8 months ago Encounter to establish care   [facility], Vernia Buff, NP              '

## 2022-07-31 ENCOUNTER — Encounter: Payer: Self-pay | Admitting: Advanced Practice Midwife

## 2022-08-16 ENCOUNTER — Telehealth: Payer: Commercial Managed Care - PPO | Admitting: Physician Assistant

## 2022-08-16 DIAGNOSIS — B3731 Acute candidiasis of vulva and vagina: Secondary | ICD-10-CM

## 2022-08-17 MED ORDER — FLUCONAZOLE 150 MG PO TABS: 150.0000 mg | ORAL_TABLET | ORAL | 0 refills | Status: DC | PRN

## 2022-08-17 NOTE — Progress Notes (Signed)
E-Visit for Vaginal Symptoms  We are sorry that you are not feeling well. Here is how we plan to help! Based on what you shared with me it looks like you: May have a yeast vaginosis  Vaginosis is an inflammation of the vagina that can result in discharge, itching and pain. The cause is usually a change in the normal balance of vaginal bacteria or an infection. Vaginosis can also result from reduced estrogen levels after menopause.  The most common causes of vaginosis are:   Bacterial vaginosis which results from an overgrowth of one on several organisms that are normally present in your vagina.   Yeast infections which are caused by a naturally occurring fungus called candida.   Vaginal atrophy (atrophic vaginosis) which results from the thinning of the vagina from reduced estrogen levels after menopause.   Trichomoniasis which is caused by a parasite and is commonly transmitted by sexual intercourse.  Factors that increase your risk of developing vaginosis include: Medications, such as antibiotics and steroids Uncontrolled diabetes Use of hygiene products such as bubble bath, vaginal spray or vaginal deodorant Douching Wearing damp or tight-fitting clothing Using an intrauterine device (IUD) for birth control Hormonal changes, such as those associated with pregnancy, birth control pills or menopause Sexual activity Having a sexually transmitted infection  Your treatment plan is Diflucan (fluconazole) '150mg'$  tablet once, may repeat in 72 hours if needed.  I have electronically sent this prescription into the pharmacy that you have chosen.  Be sure to take all of the medication as directed. Stop taking any medication if you develop a rash, tongue swelling or shortness of breath. Mothers who are breast feeding should consider pumping and discarding their breast milk while on these antibiotics. However, there is no consensus that infant exposure at these doses would be harmful.  Remember  that medication creams can weaken latex condoms. Marland Kitchen   HOME CARE:  Good hygiene may prevent some types of vaginosis from recurring and may relieve some symptoms:  Avoid baths, hot tubs and whirlpool spas. Rinse soap from your outer genital area after a shower, and dry the area well to prevent irritation. Don't use scented or harsh soaps, such as those with deodorant or antibacterial action. Avoid irritants. These include scented tampons and pads. Wipe from front to back after using the toilet. Doing so avoids spreading fecal bacteria to your vagina.  Other things that may help prevent vaginosis include:  Don't douche. Your vagina doesn't require cleansing other than normal bathing. Repetitive douching disrupts the normal organisms that reside in the vagina and can actually increase your risk of vaginal infection. Douching won't clear up a vaginal infection. Use a latex condom. Both female and female latex condoms may help you avoid infections spread by sexual contact. Wear cotton underwear. Also wear pantyhose with a cotton crotch. If you feel comfortable without it, skip wearing underwear to bed. Yeast thrives in [employer] Your symptoms should improve in the next day or two.  GET HELP RIGHT AWAY IF:  You have pain in your lower abdomen ( pelvic area or over your ovaries) You develop nausea or vomiting You develop a fever Your discharge changes or worsens You have persistent pain with intercourse You develop shortness of breath, a rapid pulse, or you faint.  These symptoms could be signs of problems or infections that need to be evaluated by a medical provider now.  MAKE SURE YOU   Understand these instructions. Will watch your condition. Will get help right  away if you are not doing well or get worse.  Thank you for choosing an e-visit.  Your e-visit answers were reviewed by a board certified advanced clinical practitioner to complete your personal care plan. Depending  upon the condition, your plan could have included both over the counter or prescription medications.  Please review your pharmacy choice. Make sure the pharmacy is open so you can pick up prescription now. If there is a problem, you may contact your provider through [employer] and have the prescription routed to another pharmacy.  Your safety is important to [place]. If you have drug allergies check your prescription carefully.   For the next 24 hours you can use MyChart to ask questions about today's visit, request a non-urgent call back, or ask for a work or school excuse. You will get an email in the next two days asking about your experience. I hope that your e-visit has been valuable and will speed your recovery.  I have spent 5 minutes in review of e-visit questionnaire, review and updating patient chart, medical decision making and response to patient.   Mar Daring, PA-C

## 2022-11-01 ENCOUNTER — Telehealth: Payer: Commercial Managed Care - PPO | Admitting: Family Medicine

## 2022-11-01 DIAGNOSIS — N898 Other specified noninflammatory disorders of vagina: Secondary | ICD-10-CM

## 2022-11-01 NOTE — Progress Notes (Signed)
Because recent intercourse with someone new, sores, and fever- I feel your condition warrants further evaluation and I recommend that you be seen in a face to face visit.   NOTE: There will be NO CHARGE for this eVisit

## 2022-11-03 ENCOUNTER — Other Ambulatory Visit (HOSPITAL_COMMUNITY)
Admission: RE | Admit: 2022-11-03 | Discharge: 2022-11-03 | Disposition: A | Discharge status: Home / Self Care | Payer: Medicaid Other | Source: Ambulatory Visit

## 2022-11-03 ENCOUNTER — Ambulatory Visit (INDEPENDENT_AMBULATORY_CARE_PROVIDER_SITE_OTHER): Payer: Medicaid Other

## 2022-11-03 DIAGNOSIS — B3731 Acute candidiasis of vulva and vagina: Secondary | ICD-10-CM

## 2022-11-03 MED ORDER — FLUCONAZOLE 150 MG PO TABS: 150.0000 mg | ORAL_TABLET | ORAL | 0 refills | Status: DC | PRN

## 2022-11-03 NOTE — Progress Notes (Signed)
Pt reports vaginal itching and pain. Pt states this is a recurring issue. She had been using OTC meds for this but states they are not working anymore.

## 2022-11-03 NOTE — Progress Notes (Signed)
   GYNECOLOGY PROBLEM OFFICE VISIT NOTE  History:  Emily Maynard is a 22 y.o. African-American female here today for vaginal itching and discharge.  She reports this is a recurrent issue that occurs 2-3 days prior to menses, but can vary.  She states the discharge is white with some "chunkiness" to it. She states she has had about 4 yeast infections since November.  She reports treatment with  2-3 day OTC yeast treatment.   She reports that she enjoys "junk: during her period including chips, cookies, and ice cream.   She reports sexual activity with condom usage and Nexplanon in place.  She declines STD testing  Past Medical History:  Diagnosis Date   ADHD (attention deficit hyperactivity disorder) 12/13/2012   Followed by Dr. Inda Coke: 03/29/2013 Rating scales do not show need for stimulants, no rx given.    Anxiety     History reviewed. No pertinent surgical history.  The following portions of the patient's history were reviewed and updated as appropriate: allergies, current medications, past family history, past medical history, past social history, past surgical history and problem list.   Health Maintenance:  Normal pap and negative HRHPV on April 2012.  No mammogram on d/t age.   Review of Systems:  Genito-Urinary ROS:  Dysuria -reports pain when urine hits skin. Gastrointestinal ROS: negative Objective:  Vitals: BP 113/75   Pulse 74   Ht 5\' 5"  (1.651 m)   Wt 157 lb 9.6 oz (71.5 kg)   BMI 26.23 kg/m   Physical Exam: Physical Exam Constitutional:      Appearance: Normal appearance.  HENT:     Head: Normocephalic and atraumatic.  Eyes:     Conjunctiva/sclera: Conjunctivae normal.  Cardiovascular:     Rate and Rhythm: Normal rate.  Pulmonary:     Effort: Pulmonary effort is normal. No respiratory distress.  Musculoskeletal:        General: Normal range of motion.     Cervical back: Normal range of motion.  Neurological:     Mental Status: She is alert and oriented  to person, place, and time.  Skin:    General: Skin is warm and dry.  Psychiatric:        Mood and Affect: Mood normal.        Behavior: Behavior normal.  Vitals reviewed.      Labs and Imaging: No results found.  Assessment & Plan:  22 year old Recurrent yeast infection  -CV collected by self swab. -Discussed treatment today with refill as necessary. -Rx for Diflucan sent to pharmacy on file. -Informed we will also treat for other infections as indicated. -Reviewed usage of boric acid.  Instructed to start dosing 5 to 7 days prior to onset of menstrual cycle. -Discussed nutritional intake and how it contributes to yeast infections. -Patient verbalizes understanding. -Encouraged to return or send MyChart message as needed.  Total face-to-face time with patient: 15 minutes   Gerrit Heck, [place] 11/03/2022 2:50 PM

## 2022-11-05 NOTE — Patient Instructions (Addendum)
Boric Acid Vaginal Suppositories  What is this medication? BORIC ACID (BOHR ik AS id) may support vaginal health. It may relieve the symptoms of a yeast and bacterial infection, such as itching, burning, and odor. COMMON BRAND NAME(S): AZO Boric Acid with Aloe Vera, Hylafem  How should I use this medication?  This medication is for use in the vagina. Do not take by mouth. Wash hands before and after use. Use this medication at bedtime, unless otherwise directed. Place in vagina fo 5-7 days prior to menstrual cycle, then as needed for 2-3 days.  Boric acid can cause death if consumed orally. Therefore you should use barrier methods during oral sex when undergoing treatment.   What if I miss a dose? If you miss a dose, use it as soon as you can. If it is almost time for your next dose, use only that dose. Do not use double or extra doses. What may interact with this medication? Interactions are not expected. Do not use any other vaginal products without telling your care team. This list may not describe all possible interactions. Give your health care provider a list of all the medicines, herbs, non-prescription drugs, or dietary supplements you use. Also tell them if you smoke, drink alcohol, or use illegal drugs. Some items may interact with your medicine. What should I watch for while using this medication? Tell your care team if your symptoms do not start to get better within a few days. It is better not to have sex until you have finished your treatment. This medication may cause condoms, diaphragms, and spermicides to not work as well. Do not rely on any of these methods to prevent sexually transmitted infections (STIs) or pregnancy while you are using this medication. Vaginal medications may come out of the vagina during treatment. To keep the medication from getting on your clothing, wear a panty liner, but change frequently to avoid concurrent infections.  The use of tampons is not  recommended. To help clear up the infection, wear freshly washed cotton, not synthetic, underwear. What side effects may I notice from receiving this medication? Side effects that you should report to your care team as soon as possible: Allergic reactions--skin rash, itching, hives, swelling of the face, lips, tongue, or throat Unusual vaginal discharge, itching, or odor Side effects that usually do not require medical attention (report to your care team if they continue or are bothersome): Vaginal irritation at the application site This list may not describe all possible side effects. Call your doctor for medical advice about side effects. You may report side effects to FDA at 1-800-FDA-1088. Where should I keep my medication? Keep out of the reach of children and pets. Store in a cool, dry place between 15 and 30 degrees C (59 and 86 degrees F). Keep away from sunlight. Throw away any unused medication after the expiration date. NOTE: This sheet is a summary. It may not cover all possible information. If you have questions about this medicine, talk to your doctor, pharmacist, or health care provider.  2023 Elsevier/Gold Standard (2021-03-23 00:00:00)

## 2022-11-08 ENCOUNTER — Other Ambulatory Visit: Payer: Self-pay | Admitting: *Deleted

## 2022-11-08 LAB — CERVICOVAGINAL ANCILLARY ONLY
Bacterial Vaginitis (gardnerella): NEGATIVE
Candida Glabrata: NEGATIVE
Candida Vaginitis: POSITIVE — AB
Chlamydia: NEGATIVE
Comment: NEGATIVE
Comment: NEGATIVE
Comment: NEGATIVE
Comment: NEGATIVE
Comment: NEGATIVE
Comment: NORMAL
Neisseria Gonorrhea: NEGATIVE
Trichomonas: NEGATIVE

## 2022-11-08 NOTE — Progress Notes (Signed)
Received RX refill request for Depo. Denied. Pt had Nexplanon placed 05/30/22 and there is no record of removal.

## 2022-11-21 ENCOUNTER — Telehealth: Payer: Commercial Managed Care - PPO | Admitting: Nurse Practitioner

## 2022-11-21 DIAGNOSIS — N898 Other specified noninflammatory disorders of vagina: Secondary | ICD-10-CM

## 2022-11-22 NOTE — Progress Notes (Signed)
Because you have had a new partner and have both vaginal discharge and pain with urination it is important that you are tested again for possible STI, I feel your condition warrants further evaluation and I recommend that you be seen for a face to face visit.  Please contact your primary care physician practice to be seen. Many offices offer virtual options to be seen via video if you are not comfortable going in person to a medical facility at this time.  You can call your primary care or your OBGYN - if there are unable to see you today or tomorrow please visit a local Urgent Care for testing.   NOTE: You will NOT be charged for this eVisit.  If you do not have a PCP, [facility] offers a free physician referral service available at [phone]. Our trained staff has the experience, knowledge and resources to put you in touch with a physician who is right for you.    If you are having a true medical emergency please call 911.   Your e-visit answers were reviewed by a board certified advanced clinical practitioner to complete your personal care plan.  Thank you for using e-Visits.

## 2022-11-29 ENCOUNTER — Ambulatory Visit (INDEPENDENT_AMBULATORY_CARE_PROVIDER_SITE_OTHER): Payer: Commercial Managed Care - PPO | Admitting: *Deleted

## 2022-11-29 ENCOUNTER — Other Ambulatory Visit (HOSPITAL_COMMUNITY)
Admission: RE | Admit: 2022-11-29 | Discharge: 2022-11-29 | Disposition: A | Discharge status: Home / Self Care | Payer: Medicaid Other | Source: Ambulatory Visit | Attending: Obstetrics and Gynecology | Admitting: Obstetrics and Gynecology

## 2022-11-29 VITALS — BP 118/78 | HR 108

## 2022-11-29 DIAGNOSIS — Z113 Encounter for screening for infections with a predominantly sexual mode of transmission: Secondary | ICD-10-CM

## 2022-11-29 DIAGNOSIS — B3731 Acute candidiasis of vulva and vagina: Secondary | ICD-10-CM | POA: Diagnosis not present

## 2022-11-29 NOTE — Progress Notes (Signed)
SUBJECTIVE:  22 y.o. female who desires a STI screen. Denies abnormal vaginal discharge, bleeding or significant pelvic pain. No UTI symptoms. Denies history of known exposure to STD. Reports recurrent yeast infections. Would still like STI testing as well.  No LMP recorded. Patient has had an implant.  OBJECTIVE:  She appears well.   ASSESSMENT:  STI Screen   PLAN:  Pt offered STI blood screening-not indicated/ recent testing. GC, chlamydia, and trichomonas probe sent to lab.  Treatment: To be determined once lab results are received.  Pt follow up as needed.

## 2022-11-30 LAB — CERVICOVAGINAL ANCILLARY ONLY
Bacterial Vaginitis (gardnerella): NEGATIVE
Candida Glabrata: NEGATIVE
Candida Vaginitis: NEGATIVE
Chlamydia: NEGATIVE
Comment: NEGATIVE
Comment: NEGATIVE
Comment: NEGATIVE
Comment: NEGATIVE
Comment: NEGATIVE
Comment: NORMAL
Neisseria Gonorrhea: NEGATIVE
Trichomonas: NEGATIVE

## 2023-05-18 IMAGING — CT CT HEAD W/O CM
3 series · 16 of 46 positions shown, 19 images · non-contrast
Comparison: None Available.

CLINICAL DATA: Panic attack. Motor neuron disease suspected. Facial
numbness with inability to speak.



[Series 2: head wo · axial · 0.37mm/px · z∈[-104,+16]mm · 10 of 29 slices shown, 13 images]
[im 3/29  brain]
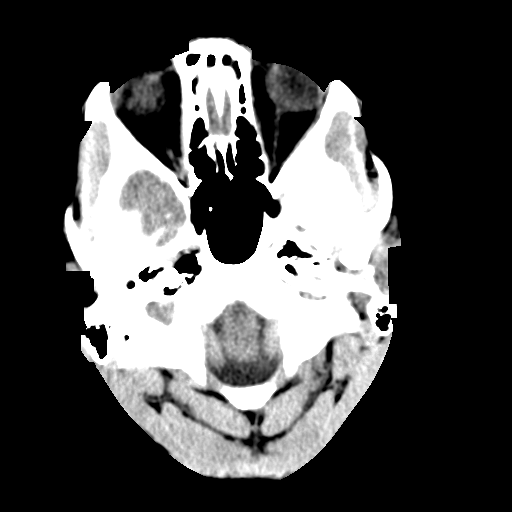
[im 3/29  bone]
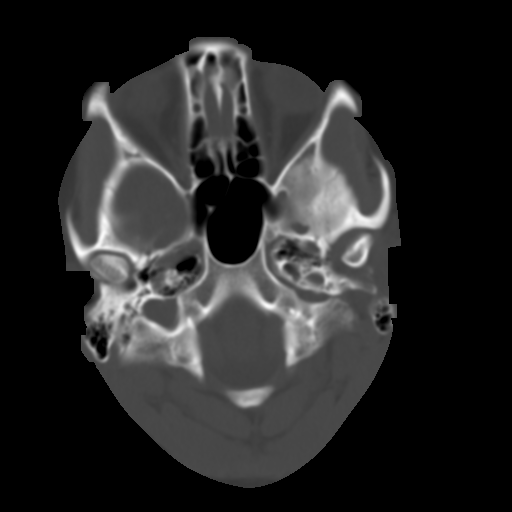
[im 6/29  brain]
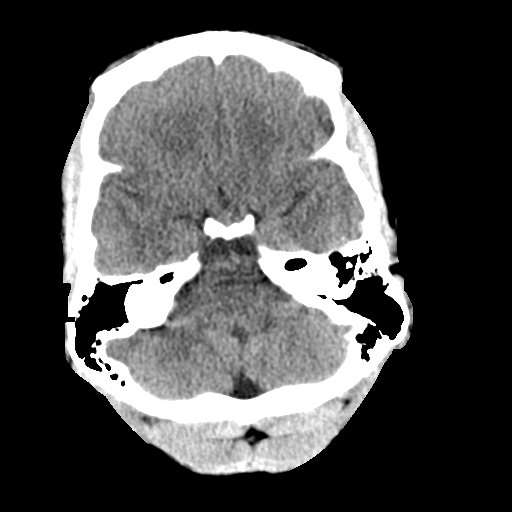
[im 8/29  brain]
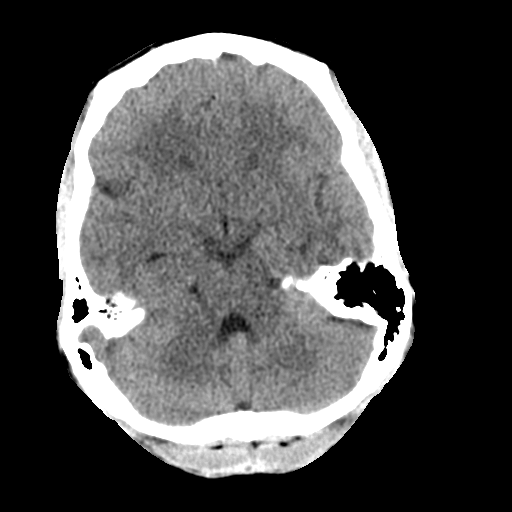
[im 11/29  brain]
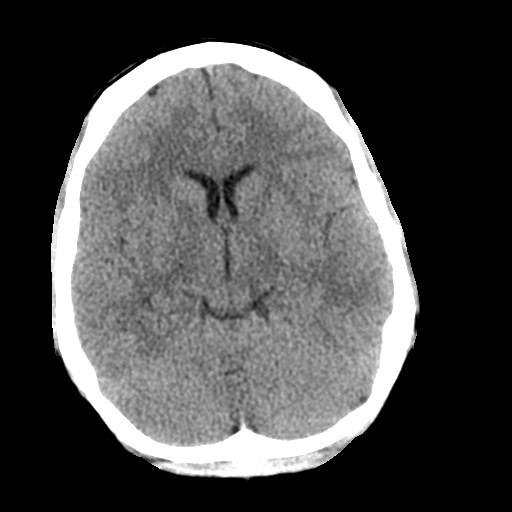
[im 14/29  brain]
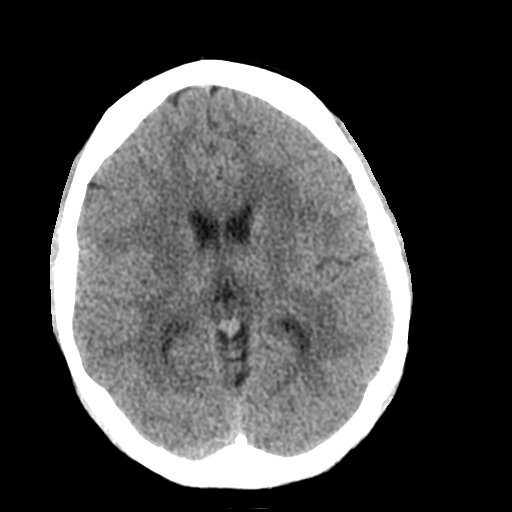
[im 14/29  bone]
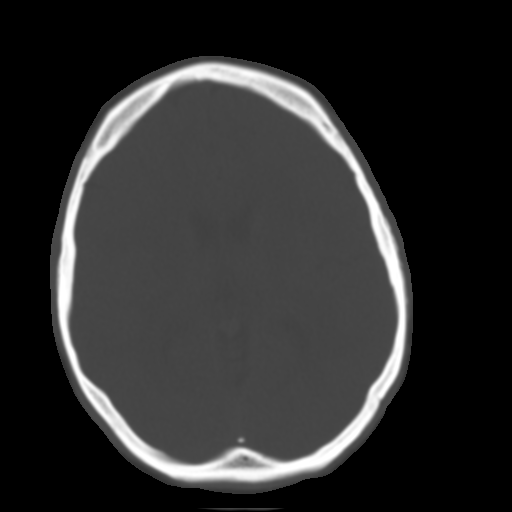
[im 16/29  brain]
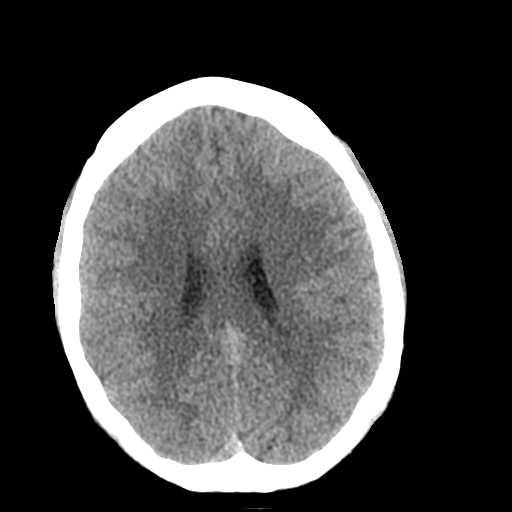
[im 19/29  brain]
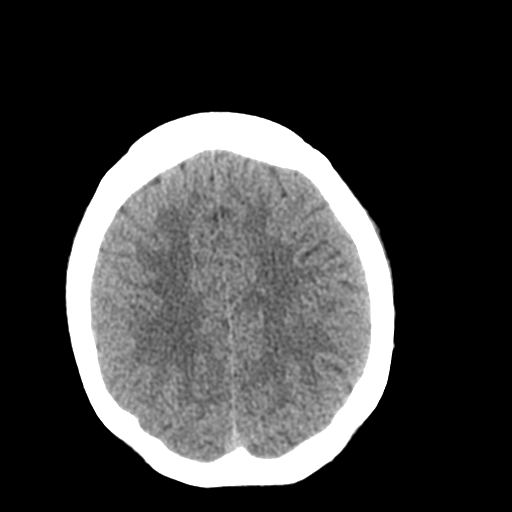
[im 22/29  brain]
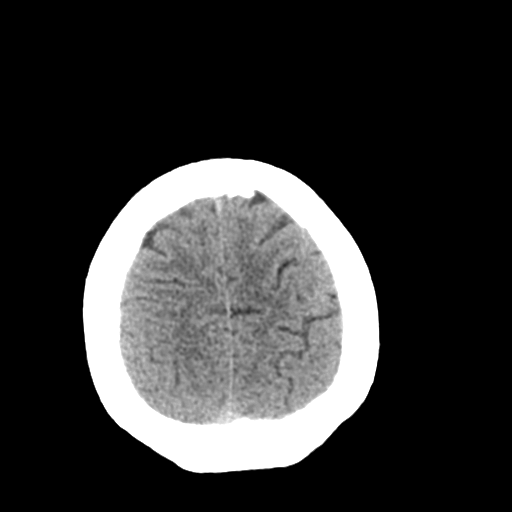
[im 24/29  brain]
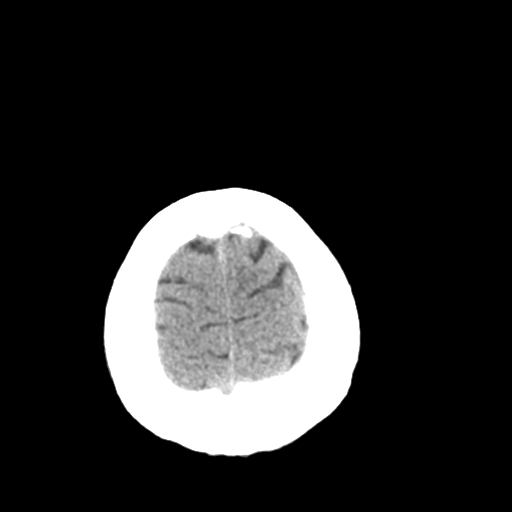
[im 24/29  bone]
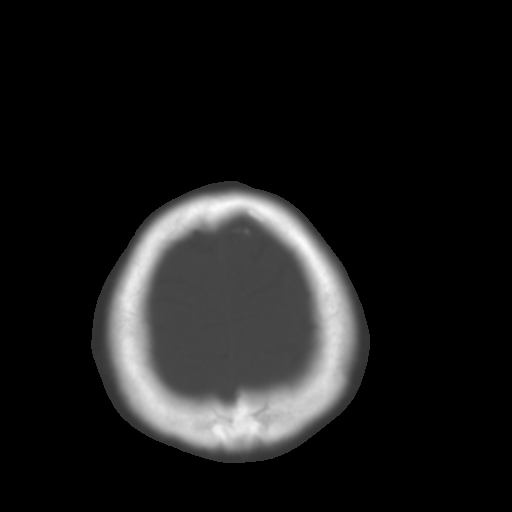
[im 27/29  brain]
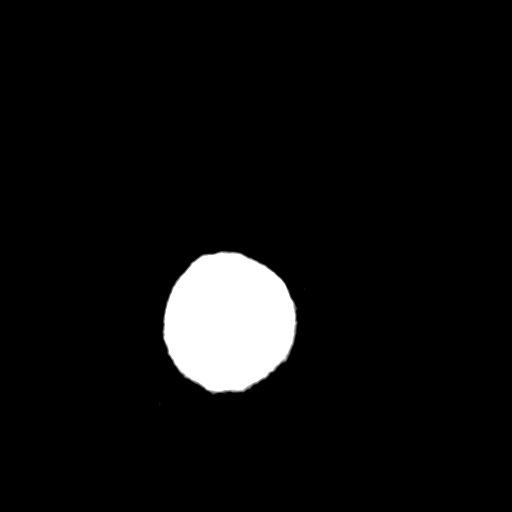

[Series 5: coronal soft tissue · coronal · 0.28mm/px · 3 of 59 slices shown]
[im 20/59  brain]
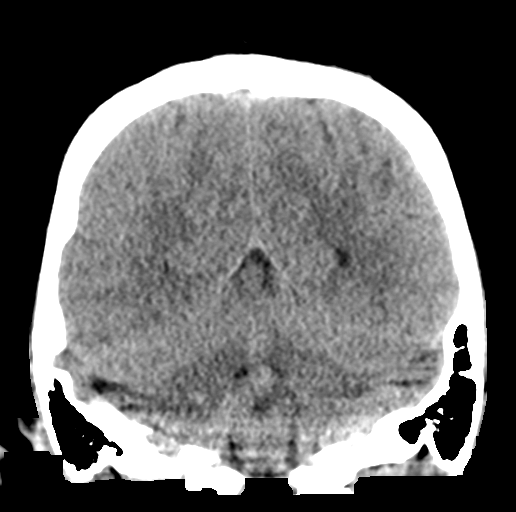
[im 26/59  brain]
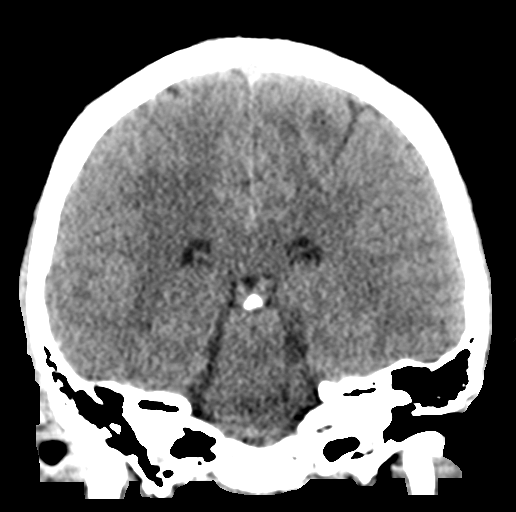
[im 33/59  brain]
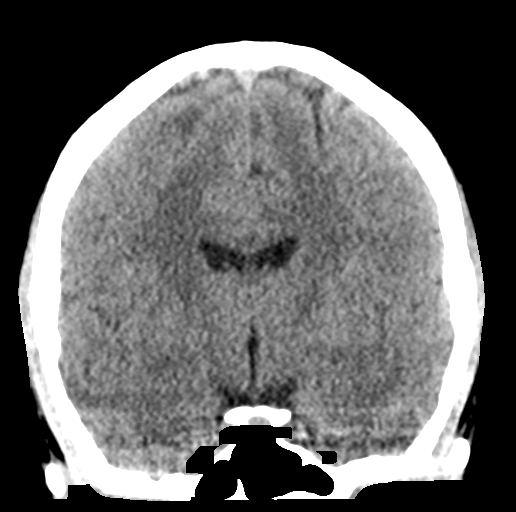

[Series 6: sagittal soft tissue · sagittal · 0.28mm/px · 3 of 49 slices shown]
[im 17/49  brain]
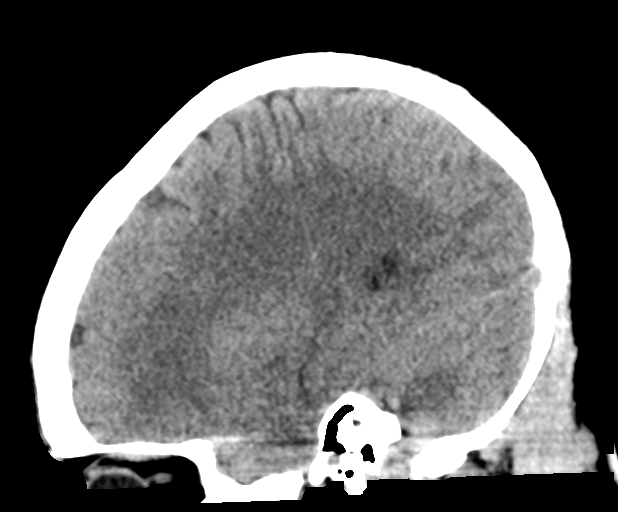
[im 25/49  brain]
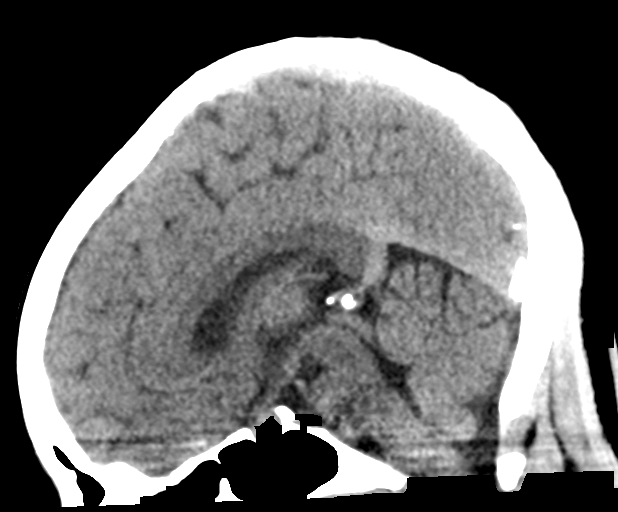
[im 33/49  brain]
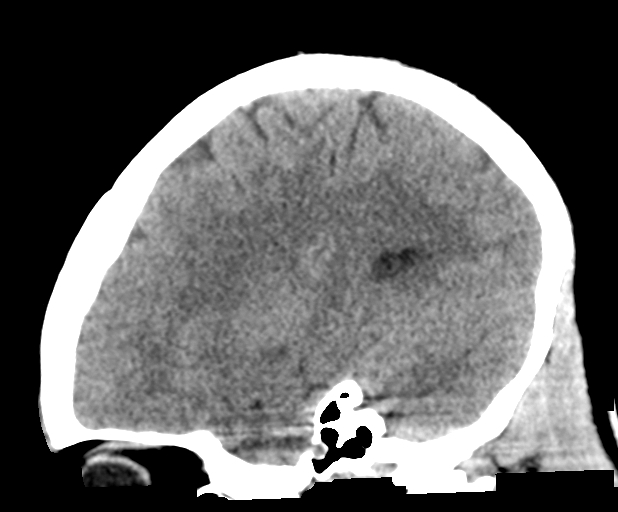

[16 of 46 positions shown; findings below may reference images not displayed]

FINDINGS: Brain: There is no evidence of acute intracranial hemorrhage, mass
lesion, brain edema or extra-axial fluid collection. The ventricles
and subarachnoid spaces are appropriately sized for age. There is no
CT evidence of acute cortical infarction. Mild beam hardening
artifact in the posterior fossa.

Vascular:  No hyperdense vessel identified.

Skull: Negative for fracture or focal lesion.

Sinuses/Orbits: The visualized paranasal sinuses and mastoid air
cells are clear. No orbital abnormalities are seen.

Other: None.
IMPRESSION: Unremarkable noncontrast head CT. No acute findings or explanation
for the patient's symptoms.

## 2023-08-24 ENCOUNTER — Telehealth: Payer: Self-pay

## 2023-08-24 NOTE — Telephone Encounter (Signed)
 Patient came in requesting a referral to an allergist, stated she needs a signed letter for the [employer] saying she is allergic to shellfish.

## 2023-08-25 NOTE — Telephone Encounter (Signed)
 Schedule for a 410 virtual visit please next week

## 2023-08-29 NOTE — Telephone Encounter (Signed)
 Appointment scheduled.

## 2023-09-01 ENCOUNTER — Telehealth (HOSPITAL_BASED_OUTPATIENT_CLINIC_OR_DEPARTMENT_OTHER): Admitting: Nurse Practitioner

## 2023-09-01 DIAGNOSIS — Z91018 Allergy to other foods: Secondary | ICD-10-CM

## 2023-09-01 NOTE — Progress Notes (Signed)
 Virtual Visit Consent   Emily Maynard, you are scheduled for a virtual visit with a [facility] provider today. Just as with appointments in the office, your consent must be obtained to participate. Your consent will be active for this visit and any virtual visit you may have with one of our providers in the next 365 days. If you have a MyChart account, a copy of this consent can be sent to you electronically.  As this is a virtual visit, video technology does not allow for your provider to perform a traditional examination. This may limit your provider's ability to fully assess your condition. If your provider identifies any concerns that need to be evaluated in person or the need to arrange testing (such as labs, EKG, etc.), we will make arrangements to do so. Although advances in technology are sophisticated, we cannot ensure that it will always work on either your end or our end. If the connection with a video visit is poor, the visit may have to be switched to a telephone visit. With either a video or telephone visit, we are not always able to ensure that we have a secure connection.  By engaging in this virtual visit, you consent to the provision of healthcare and authorize for your insurance to be billed (if applicable) for the services provided during this visit. Depending on your insurance coverage, you may receive a charge related to this service.  I need to obtain your verbal consent now. Are you willing to proceed with your visit today? Emily Maynard has provided verbal consent on 10/15/2023 for a virtual visit (video or telephone). Emily Dean, NP  Date: 10/15/2023 11:11 PM   Virtual Visit via Video Note   I, Emily Maynard, connected with  Emily Maynard  ([id number], 11-14-2000) on 10/15/23 at  4:10 PM EDT by a video-enabled telemedicine application and verified that I am speaking with the correct person using two identifiers.  Location: Patient: Virtual Visit Location Patient:  Home Provider: Virtual Visit Location Provider: Home Office   I discussed the limitations of evaluation and management by telemedicine and the availability of in person appointments. The patient expressed understanding and agreed to proceed.    History of Present Illness: Emily Maynard is a 23 y.o. who identifies as a female who was assigned female at birth, and is being seen today for possible allergy to shellfish.  Emily Maynard has a history of eye swelling and shortness of breath when eating shellfish.  She has never been diagnosed via allergy testing. She went to a seafood restaurant and had crab and her eyes started to swell and it was hard to breathe. She took benadryl for her symptoms and they resolved.   Stopped taking lexapro  due to side effects. 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 (Patient not taking: Reported on 11/03/2022), Disp: 1 mL, Rfl: 4  Observations/Objective: Patient is well-developed, well-nourished in no acute distress.  Resting comfortably at home.  Head is normocephalic, atraumatic.  No labored breathing.  Speech is clear  and coherent with logical content.  Patient is alert and oriented at baseline.    Assessment and Plan: 1. Food allergy (Primary) - Ambulatory referral to Allergy Would like allergy testing  Follow Up Instructions: I discussed the assessment and treatment plan with the patient. The patient was provided an opportunity to ask questions and all were answered.  The patient agreed with the plan and demonstrated an understanding of the instructions.  A copy of instructions were sent to the patient via MyChart unless otherwise noted below.    The patient was advised to call back or seek an in-person evaluation if the symptoms worsen or if the condition fails to improve as anticipated.    Millard Bautch W Davanee Klinkner, NP

## 2023-10-15 ENCOUNTER — Encounter: Payer: Self-pay | Admitting: Nurse Practitioner

## 2023-12-29 ENCOUNTER — Encounter: Payer: Self-pay | Admitting: Advanced Practice Midwife
